# Patient Record
Sex: Female | Born: 1970 | Race: White | Hispanic: No | Marital: Married | Smoking: Never smoker
Health system: Southern US, Community
[De-identification: ages and names within clinical notes are randomized; demographics above are authoritative.]

## PROBLEM LIST (undated history)

## (undated) DIAGNOSIS — D6861 Antiphospholipid syndrome: Secondary | ICD-10-CM

## (undated) DIAGNOSIS — I2699 Other pulmonary embolism without acute cor pulmonale: Secondary | ICD-10-CM

## (undated) HISTORY — PX: STRABISMUS SURGERY: SHX218

## (undated) HISTORY — DX: Antiphospholipid syndrome: D68.61

## (undated) HISTORY — PX: SMALL INTESTINE SURGERY: SHX150

## (undated) HISTORY — DX: Other pulmonary embolism without acute cor pulmonale: I26.99

---

## 1998-06-03 DIAGNOSIS — D6861 Antiphospholipid syndrome: Secondary | ICD-10-CM

## 1998-06-03 HISTORY — DX: Antiphospholipid syndrome: D68.61

## 1998-08-04 ENCOUNTER — Other Ambulatory Visit: Admission: RE | Admit: 1998-08-04 | Discharge: 1998-08-04 | Payer: Self-pay | Admitting: Obstetrics and Gynecology

## 1998-08-18 ENCOUNTER — Ambulatory Visit (HOSPITAL_COMMUNITY): Admission: RE | Admit: 1998-08-18 | Discharge: 1998-08-18 | Payer: Self-pay | Admitting: Internal Medicine

## 1998-08-21 ENCOUNTER — Ambulatory Visit (HOSPITAL_COMMUNITY): Admission: RE | Admit: 1998-08-21 | Discharge: 1998-08-21 | Payer: Self-pay | Admitting: Internal Medicine

## 1998-08-21 ENCOUNTER — Encounter: Payer: Self-pay | Admitting: Internal Medicine

## 1998-11-08 ENCOUNTER — Encounter: Payer: Self-pay | Admitting: Internal Medicine

## 1998-11-08 ENCOUNTER — Ambulatory Visit: Admission: RE | Admit: 1998-11-08 | Discharge: 1998-11-08 | Payer: Self-pay | Admitting: Internal Medicine

## 1999-03-09 ENCOUNTER — Other Ambulatory Visit: Admission: RE | Admit: 1999-03-09 | Discharge: 1999-03-09 | Payer: Self-pay | Admitting: Psychology

## 1999-03-09 ENCOUNTER — Encounter (INDEPENDENT_AMBULATORY_CARE_PROVIDER_SITE_OTHER): Payer: Self-pay | Admitting: Specialist

## 1999-11-15 ENCOUNTER — Other Ambulatory Visit: Admission: RE | Admit: 1999-11-15 | Discharge: 1999-11-15 | Payer: Self-pay | Admitting: Obstetrics and Gynecology

## 1999-11-22 ENCOUNTER — Ambulatory Visit (HOSPITAL_COMMUNITY): Admission: RE | Admit: 1999-11-22 | Discharge: 1999-11-22 | Payer: Self-pay | Admitting: Obstetrics and Gynecology

## 1999-11-22 ENCOUNTER — Encounter (INDEPENDENT_AMBULATORY_CARE_PROVIDER_SITE_OTHER): Payer: Self-pay

## 2000-09-07 ENCOUNTER — Inpatient Hospital Stay (HOSPITAL_COMMUNITY): Admission: AD | Admit: 2000-09-07 | Discharge: 2000-09-07 | Payer: Self-pay | Admitting: Obstetrics and Gynecology

## 2000-09-07 ENCOUNTER — Encounter: Payer: Self-pay | Admitting: Obstetrics and Gynecology

## 2000-09-18 ENCOUNTER — Inpatient Hospital Stay (HOSPITAL_COMMUNITY): Admission: AD | Admit: 2000-09-18 | Discharge: 2000-09-18 | Payer: Self-pay | Admitting: *Deleted

## 2000-11-09 ENCOUNTER — Inpatient Hospital Stay (HOSPITAL_COMMUNITY): Admission: AD | Admit: 2000-11-09 | Discharge: 2000-11-09 | Payer: Self-pay | Admitting: Obstetrics and Gynecology

## 2000-11-18 ENCOUNTER — Encounter (INDEPENDENT_AMBULATORY_CARE_PROVIDER_SITE_OTHER): Payer: Self-pay

## 2000-11-18 ENCOUNTER — Inpatient Hospital Stay (HOSPITAL_COMMUNITY): Admission: AD | Admit: 2000-11-18 | Discharge: 2000-11-22 | Payer: Self-pay | Admitting: Obstetrics and Gynecology

## 2000-12-05 ENCOUNTER — Encounter: Admission: RE | Admit: 2000-12-05 | Discharge: 2001-01-04 | Payer: Self-pay | Admitting: Obstetrics and Gynecology

## 2001-01-02 ENCOUNTER — Other Ambulatory Visit: Admission: RE | Admit: 2001-01-02 | Discharge: 2001-01-02 | Payer: Self-pay | Admitting: Obstetrics and Gynecology

## 2001-02-20 ENCOUNTER — Encounter: Admission: RE | Admit: 2001-02-20 | Discharge: 2001-02-20 | Payer: Self-pay | Admitting: Urology

## 2001-02-20 ENCOUNTER — Encounter: Payer: Self-pay | Admitting: Urology

## 2001-04-16 ENCOUNTER — Encounter: Payer: Self-pay | Admitting: Urology

## 2001-04-23 ENCOUNTER — Encounter (INDEPENDENT_AMBULATORY_CARE_PROVIDER_SITE_OTHER): Payer: Self-pay | Admitting: Specialist

## 2001-04-23 ENCOUNTER — Inpatient Hospital Stay (HOSPITAL_COMMUNITY): Admission: RE | Admit: 2001-04-23 | Discharge: 2001-05-02 | Payer: Self-pay | Admitting: Urology

## 2002-02-09 ENCOUNTER — Encounter (INDEPENDENT_AMBULATORY_CARE_PROVIDER_SITE_OTHER): Payer: Self-pay

## 2002-02-09 ENCOUNTER — Ambulatory Visit (HOSPITAL_COMMUNITY): Admission: RE | Admit: 2002-02-09 | Discharge: 2002-02-09 | Payer: Self-pay | Admitting: Gastroenterology

## 2002-06-18 ENCOUNTER — Other Ambulatory Visit: Admission: RE | Admit: 2002-06-18 | Discharge: 2002-06-18 | Payer: Self-pay | Admitting: Obstetrics and Gynecology

## 2003-07-18 ENCOUNTER — Other Ambulatory Visit: Admission: RE | Admit: 2003-07-18 | Discharge: 2003-07-18 | Payer: Self-pay | Admitting: Dermatology

## 2004-11-29 ENCOUNTER — Other Ambulatory Visit: Admission: RE | Admit: 2004-11-29 | Discharge: 2004-11-29 | Payer: Self-pay | Admitting: Obstetrics and Gynecology

## 2008-05-14 ENCOUNTER — Inpatient Hospital Stay (HOSPITAL_COMMUNITY): Admission: AD | Admit: 2008-05-14 | Discharge: 2008-05-14 | Payer: Self-pay | Admitting: Obstetrics and Gynecology

## 2008-05-30 ENCOUNTER — Inpatient Hospital Stay (HOSPITAL_COMMUNITY): Admission: RE | Admit: 2008-05-30 | Discharge: 2008-06-02 | Payer: Self-pay | Admitting: Obstetrics and Gynecology

## 2010-10-16 NOTE — H&P (Signed)
Holly Tanner, Holly Tanner     ACCOUNT NO.:  [id number]   MEDICAL RECORD NO.:  [id number]          PATIENT TYPE:  INP   LOCATION:                                F[facility]ILITY:  [facility]   PHYSICIAN:  Duke Salvia. Marcelle Overlie, M.D.DATE OF BIRTH:  07/24/1970   DATE OF ADMISSION:  05/30/2008  DATE OF DISCHARGE:                              HISTORY & PHYSICAL   Date of scheduled surgery is December 28.   CHIEF COMPLAINT:  For repeat cesarean section.   HISTORY OF PRESENT ILLNESS:  This is a 40 year old G56, P1-0-2-1, EDD is  January 7, presents for repeat cesarean section.  She has a complicated  history in that she had delivered by cesarean in 2002 for CPD.  She has  a history of Crohn disease later that same year in 2002 when she  developed worsening abdominal pain.  She underwent midline laparotomy  with bowel resection, at which time she had an enterovesical fistula  repaired.  She has seen Dr. 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   Her GI doctor is Dr. Kinnie Scales.   This pregnancy has been uncomplicated with reactive weekly nonstress  tests.  She has had normal serial ultrasounds and has also been seen by  general surgery.  Presents at this time for scheduled repeat cesarean  section with general surgery in attendance.  The risks related to  bleeding, infection, adjacent organ injury, or the need for additional  surgery especially in regards to bowel adhesions were all reviewed with  her, which she understands and accepts.   She declined amniocentesis.  Her first trimester screen was normal.  One-  hour GTT was normal.   PAST MEDICAL HISTORY:   ALLERGIES:  1. TALWIN.  2. SULFA.  3. VICODIN.  4. ERYTHROMYCIN.  5. PENICILLIN.  6. CODEINE.  7. HYDROCODONE.   CURRENT MEDICATIONS:  Prenatal vitamins early in the pregnancy.  She was  on Lialda for Crohn disease which was discontinued during the pregnancy.  Blood type is B+, rubella  titer is equivocal.   OBSTETRICAL HISTORY:  She has had TAB in 1996, SAB in 2001 with a D and  E, cesarean section 2002 for CPD and macrosomia.   FAMILY. HISTORY:  Please see her [place] form for details.   PHYSICAL EXAM:  Temperature 98.2, blood pressure 120/82.  HEENT:  Unremarkable.  NECK:  Supple without masses.  LUNGS:  Clear.  CARDIOVASCULAR:  Regular rate and rhythm without murmurs, rubs or  gallops.  BREASTS:  Without masses.  Term fundal height.  Fetal heart rate 140.  Cervix was closed.  EXTREMITIES:  Reveal 1+ edema.  Reflexes 1-2+, no  clonus.   IMPRESSION:  1. Term pregnancy, previous cesarean section.  2. History of Crohn disease.  3. 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  4. Factor V Leiden.   PLAN:  Repeat cesarean section.  The procedure and risks reviewed as  above.  General surgery  will be in attendance.  She declined tubal  ligation per Dr. Shirlee More recommendation.  Plan Lovenox daily for 6-8  weeks postop.      Richard M. Marcelle Overlie, M.D.  Electronically Signed     RMH/MEDQ  D:  05/26/2008  T:  05/26/2008  Job:  [id number]

## 2010-10-16 NOTE — Op Note (Signed)
Holly Tanner, Holly Tanner               ACCOUNT NO.:  [id number]   MEDICAL RECORD NO.:  [id number]          PATIENT TYPE:  INP   LOCATION:  9121                          [facility]ILITY:  [facility]   PHYSICIAN:  Duke Salvia. Marcelle Overlie, M.D.DATE OF BIRTH:  [birth year]   DATE OF PROCEDURE:  05/30/2008  DATE OF DISCHARGE:                               OPERATIVE REPORT   PREOPERATIVE DIAGNOSES:  1. Term intrauterine pregnancy with previous cesarean section.  2. Repeat cesarean section.  3. History of Crohn disease with prior laparotomy.   POSTOPERATIVE DIAGNOSES:  1. Term intrauterine pregnancy with previous cesarean section.  2. Repeat cesarean section.  3. History of Crohn disease with prior laparotomy.   PROCEDURE:  Repeat low transverse cesarean section, lysis of adhesions.   ASSISTANT:  Juluis Mire, M.D.   ANESTHESIA:  Spinal.   COMPLICATIONS:  None.   DRAINS:  Foley catheter.   ESTIMATED BLOOD LOSS:  700 mL.   PROCEDURE AND FINDINGS:  The patient is taken to the operating room  after an adequate level of spinal anesthetic was obtained with the  patient in the left tilt position, the abdomen prepped and draped in the  usual manner for sterile abdominal procedures.  Foley catheter  positioned draining clear urine.  She received cefoxitin 1 g IV preop.  Pfannenstiel incision was made through her old scar, which was well  healed.  This is carried down to the fascia, which was incised and  extended transversely.  Rectus muscle divided in the midline.  Peritoneum entered superiorly without incident and extended vertically.  There were no significant adhesions into this area despite her history  of midline laparotomy and Crohn disease.  The vesicouterine serosa was  then incised.  The bladder was sharply and bluntly dissected below.  Bladder blade was repositioned.  Transverse incision made the lower  segment extended with bandage scissors.  Clear fluid noted.  The patient  delivered a healthy  female.  Apgars 9 and 9.  Weight is pending.  The  infant was suctioned, cord clamped and passed to pediatric team for  further care.  Placenta was delivered manually intact after drawing cord  blood specimens.  Cavity was wiped clean with laparotomy pack.  Fair  closure obtained with first layer of 0 chromic in a locked fashion  followed by an imbricating layer of 0 chromic.  This was hemostatic.  The bladder flap area was intact and hemostatic.  Right tube and ovary  were normal.  There was some filmy adhesions in the area of the left  tube and ovary, which were lysed in avascular plane.  No other  significant anterior uterine adhesions noted.  Prior to closure sponge,  needle and counts reported as correct x2.  Peritoneum closed with  running 2-0 Vicryl suture.  A 0 PDS was then used to close the fascia  from laterally to  midline on either side.  Prior to this rectus muscles reapproximated  midline with 2-0 Vicryl sutures.  Subcutaneous tissue was hemostatic.  Clips and Steri-Strips used on the skin along with a pressure dressing.  She tolerated  this well, went to recovery room in good condition.      Richard M. Marcelle Overlie, M.D.  Electronically Signed     RMH/MEDQ  D:  05/30/2008  T:  05/31/2008  Job:  [id number]

## 2010-10-19 NOTE — Discharge Summary (Signed)
[facility]  Patient:    Holly Tanner, Holly Tanner Visit Number: [id number] MRN: [id number]          Service Type: SUR Location: 4E [id number] [id number] Attending Physician:  Evlyn Clines Dictated by:   Excell Seltzer. Annabell Howells, M.D. Admit Date:  04/23/2001 Discharge Date: 05/02/2001   CC:         Velora Heckler, M.D.  Wilhemina Bonito. Eda Keys., M.D. [facility]  Richard M. Marcelle Overlie, M.D.  Kari Baars, M.D.   Discharge Summary  BRIEF HISTORY:  Holly Tanner is a 40 year old white female who was originally seen in consultation by Dr. Duke Salvia. [place] for recurring UTIs.  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  Dr. Velora Heckler was consulted and she is now admitted for resection of an involved bowel and closure of intravesical fistula.  ALLERGIES:  Pertinent for allergies to PENICILLIN, ERYTHROMYCIN, CODEINE, HYDROCODONE, and TALWIN.  MEDICATIONS:  Prenatal vitamin.  PAST MEDICAL HISTORY:  Significant for the Crohns disease.  She has also had an antiphospholipid with factor-5 abnormality.  PAST SURGICAL HISTORY:  Pertinent for cesarean section November 18, 2000.  A D&C on November 21, 1999.  Colonoscopy in 2000 and a second in 2001.  She had eye surgeries at age 89 and 44.  SOCIAL HISTORY:  No tobacco or alcohol.  FAMILY HISTORY:  Pertinent for the factor-5 abnormality.  For more details of the history and physical, please see the dictated note.  LABORATORY DATA:  Preoperative CT scan suggested involvement of possibly the sigmoid as well as the ileum in addition to the bladder.  Hemoglobin 13.7, white count 9. Chemistries within normal limits with the exception of sodium 146.  The PT was 13.3.  Urinalysis had 11 to 20 white cells per high powered field, too numerous to count red cells.  EKG showed normal sinus rhythm and normal EKG.  Chest x-ray showed no active disease.  HOSPITAL COURSE:  The patient was  admitted to the hospital on April 23, 2001.  She had received a mechanical and antibiotic bowel prep as well as perioperative antibiotics.  She then underwent abdominal exploration and was found to have both a colovesical fistula, enteric fistula.  Ileocecal resection was performed as well as partial sigmoid colectomy.  She also underwent partial cystectomy for closure of the intravesical fistula. She tolerated surgery well and was left with a Foley catheter.  Postoperatively, she was left with a morphine PCA; however, this was inadequate for pain control and she was switched to a Dilaudid PCA with improved pain control.  She had some sort of trigger point in her right lower quadrant which she had at previous surgery and this was causing significant discomfort.  She was kept in the intensive care unit initially for close monitoring.  Ativan was added on the first postoperative day.  She was doing well apart from her pain.  She also had near panic attack episodes.  Her hemoglobin was 13.8, white count 12.5, BMP within normal limits.  She had Ativan added for anxiety.  Repeat BMP later in the day revealed a low potassium of 3.3.  We ran some KCl in her IV fluids had increased potassium placed.  On the second postoperative day, her potassium was up to 3.9.  Her creatinine was 0.6.  She was very emotional throughout with complaints of her chronic abdominal tenderness on the right side.  Temperature maximum was afebrile on the second postoperative  day.  On the third postoperative day, her temperature maximum was 100.4, which she remained afebrile.  Urine output Foley was draining well.  Outputs were increasing.  She continued to have specific pain in her right lower quadrant.  On the fourth postoperative day, she was in much better spirits still having some right lower quadrant pain.  She had no flatus or bowel movement.  Her hemoglobin was 11.7, white count 7.8, electrolytes were  unremarkable.  She was noted to have a few bowel sounds.  On the fifth postoperative day, she was complaining of discomfort from her Foley catheter.  She was given some Levsin SL for bladder spasm.  She did tolerate plugging of her NG tube so it was removed but she was kept NPO for her ileostomy.  On the 27th, she was having a little bit of vaginal bloody discharge but no other complaints.  On the 28th, she continued to do well without fever.  She was having occasional nausea.  She had been allowed clear liquids but was not taking much urine.  She was slightly distended on clear liquids.  On the 28th, her Foley catheter was removed.  Her only urologic complaint was some mild dysuria after removal of her Foley catheter.  On the 29th, her bowel function seemed to be returning and her belly was soft. Her wound was intact.  Her diet was advanced.  On the 30th, her staples were removed.   She was felt to be ready for discharge home.  She was voiding without complaint.  She was tolerating her diet.  DISCHARGE MEDICATIONS:  Resumption of her preoperative medications.  She was given Percocet for pain and Ativan for anxiety.  FOLLOW-UP:  Instructed to follow up with Dr. Excell Seltzer. [place] in two weeks and Dr. Velora Heckler in one week.  FINAL DIAGNOSES: 1. 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 2. Antiphospholipid syndrome factor-5 abnormality.  DISPOSITION:  To home.  COMPLICATIONS:  Her admission was complicated by prolonged postoperative ileus.  Her condition is improved.  Her prognosis is good. Dictated by:   Excell Seltzer. Annabell Howells, M.D. Attending Physician:  Evlyn Clines DD:  05/29/01 TD:  05/29/01 Job: [id number]

## 2010-10-19 NOTE — Op Note (Signed)
[facility]  Patient:    Holly Tanner, Holly Tanner                   MRN: [id number] Attending:  Cordelia Pen Ii                           Operative Report  PREOPERATIVE DIAGNOSIS:       Term intrauterine pregnancy.  Fetal macrosomia by ultrasound, declines trial of labor.  POSTOPERATIVE DIAGNOSIS:      Term intrauterine pregnancy.  Fetal macrosomia by ultrasound, declines trial of labor.  OPERATION:                    Primary low transverse cesarean section.  SURGEON:                      Duke Salvia. Marcelle Overlie, M.D.  ASSISTANT:  ANESTHESIA:                   Spinal.  COMPLICATIONS:                None.  DRAINS:                       Foley catheter.  ESTIMATED BLOOD LOSS:         900 cc.  DESCRIPTION OF PROCEDURE:     The patient was taken to the operating room and after an adequate level of spinal anesthetic was obtained with the patient in left tilt position, the abdomen was prepped and draped in the usual fashion for sterile abdominal procedures.  Foley catheter positioned draining clear urine.  Transverse incision was made two fingerbreadths above the symphysis and carried down to the fascia which was excised and extended transversely. Rectus muscles divided in the midline.  The peritoneum entered superiorly without incident and extended in a vertical manner.  The bladder blade was positioned.  The vesicouterine serosa was incised and there was some scarring on the left angle of the uterus from either colon or a loop of intestine, it was difficult to tell.  This patient does have a history of Crohns disease. There was also some scarring noted in the area of the bladder flap making dissection of the vesicouterine serosa difficult.  Therefore, incision in the uterus was made higher up free and clear of this area.  This was extended with bandage scissors.  Clear amniotic fluid was noted.  Easy delivery of a 7 pound 13 ounce female, Apgars 8  and 9.  Cord blood was obtained, also sent specimen for the blood bank registry per protocol.  The placenta delivered manually intact and sent to pathology.  The uterus was exteriorized.  The cavity was wiped clean with laparotomy pack.  The tubes and ovaries were normal. Inspection of the left angle of incision revealed the bladder to be well below the incision.  Again the above mentioned scarring of either colon or small bowel into the area of the left round ligament and left lateral uterus were free and clear from the surgical site.  These areas were not dissected. Closure obtained with the first layer of 0 chromic in a locked fashion followed by an imbricating layer of 0 chromic.  This was hemostatic.  Prior to closure, sponge, needle, and instrument counts were correct x 2.  The rectus muscles were reapproximated with 2-0 Dexon interrupted sutures.  Fascia closed from  laterally to midline on either side with a 0 Panocryl suture. Subcutaneous fat was hemostatic after irrigation.  Clips and Steri-Strips were used on the skin.  She did receive Pitocin IV after the cord was clamped along with Cefotan 1 gram IV.  She will receive anticoagulation for six weeks postpartum.  I will contact Dr. Asencion Islam, her hematologist at [facility] for their protocol. DD:  11/18/00 TD:  11/18/00 Job: [id number]

## 2010-10-19 NOTE — Discharge Summary (Signed)
Holly Tanner, Holly Tanner               ACCOUNT NO.:  [id number]   MEDICAL RECORD NO.:  [id number]          PATIENT TYPE:  INP   LOCATION:  9121                          [facility]ILITY[facility]   PHYSICIAN:  Duke Salvia. Marcelle Overlie, M.D.DATE OF BIRTH:  27-Jan-1971   DATE OF ADMISSION:  05/30/2008  DATE OF DISCHARGE:  06/02/2008                               DISCHARGE SUMMARY   ADMITTING DIAGNOSES:  1. Intrauterine pregnancy at term.  2. Previous cesarean section.  3. History of Crohn disease with bowel resection.  4. Factor V Leiden.   DISCHARGE DIAGNOSES:  1. Status post low transverse cesarean section.  2. A viable female infant.  3. Factor V Leiden.   PROCEDURE:  Repeat low transverse cesarean section.   REASON FOR ADMISSION:  Please see dictated H&P.   HOSPITAL COURSE:  The patient is a 37-year gravida 4, para 1-0-2-1 that  was admitted to [facility] for scheduled cesarean  section.  The patient's pregnancy had been complicated by history of her  previous cesarean section in 2002 for cephalopelvic disproportion.  The  patient also did have a history of Crohn disease which later in the same  year had worsened, the patient had undergone a laparotomy with a bowel  resection at which time she developed an enterovesical fistula, which  was repaired.  The patient also had a history of being a carrier factor  V Leiden.  On the morning of admission, the patient was taken to  operating room where spinal anesthesia was administered without  difficulty.  A low transverse incision was made with delivery of a  viable female infant, weighing 8 pounds 2 ounces with Apgars of 9 at 1-  minute and 9 at 5 minutes.  The patient tolerated the procedure well and  taken to the recovery room in stable condition.  On postoperative day  #1, the patient complained of some gas pain.  Vital signs were otherwise  stable.  She was afebrile.  Abdomen, soft and nontender.  Abdominal  dressing noted to be clean,  dry, and intact.  On postoperative day #2,  the patient did complain of some abdominal pain related to gas, vital  signs were stable with blood pressure 105/71.  She was afebrile.  Incision was clean, dry, and intact.  Abdomen was moderately distended  with deep bowel sounds.  The patient was encouraged to ambulate and  Dulcolax suppository was given.  On postoperative day #3, the patient  had had some results from the suppository.  Vital signs were otherwise  stable.  Abdomen was soft with good return of bowel function.  Fundus  was firm and nontender.  Incision was noted to have some slight erythema  noted superior to the incisional site.  Staples were left intact.  Discharge instructions were given and the patient was later discharged  home.   CONDITION ON DISCHARGE:  Stable.   DIET:  Regular as tolerated.   ACTIVITY:  No heavy lifting, no driving x2 weeks,  no vaginal entry.   FOLLOWUP:  The patient is to follow up in the office in 2 days  for an  incision check and staple removal.  She is to call for temperature  greater than 100 degrees, persistent nausea, vomiting, heavy vaginal  bleeding and/or redness or drainage from the incisional site.   DISCHARGE MEDICATIONS:  1. Tylox #30, one p.o., every 4-6 hours.  2. Lovenox subcu daily.  3. Prenatal vitamins one p.o. daily.      Julio Sicks, N.P.      Richard M. Marcelle Overlie, M.D.  Electronically Signed    CC/MEDQ  D:  07/11/2008  T:  07/11/2008  Job:  [id number]

## 2010-10-19 NOTE — Discharge Summary (Signed)
[facility]  Patient:    Holly Tanner, Holly Tanner                   MRN: [id number] Adm. Date:  [id number] Disc. Date: [id number] Attending:  Rhina Brackett Dictator:   Danie Chandler, R.N.                           Discharge Summary  ADMITTING DIAGNOSES:          1. Intrauterine pregnancy at term.                               2. Fetal macrosomia by ultrasound, declines                                  trial of labor.  DISCHARGE DIAGNOSES:          1. Intrauterine pregnancy at term.                               2. Fetal macrosomia by ultrasound, declines                                  trial of labor.  PROCEDURE:                    On November 18, 2000 primary low transverse cesarean section.  REASON FOR ADMISSION:         Please see dictated H&P.  HOSPITAL COURSE:              The patient was taken to the operating room and underwent the above named procedure without complications.  This was productive of a viable female infant with Apgars of 8 at one minute and 9 at five minutes.  Postoperatively the patient was placed on Lovenox and Coumadin due to factor 5 Leiden deficiency and on postoperative day #1 the patient was without complaints except for some soreness.  Her hemoglobin on this day was 10.8, hematocrit 32.4, white blood cell count 14.4.  On postoperative day #2 the patient was continued on her Lovenox and was started on the Coumadin.  On postoperative day #3 she was tolerating a regular diet and ambulating well without difficulty.  She also had good pain control and good return of bowel function.  She was discharged home on postoperative day #4.  CONDITION ON DISCHARGE:       Good.  DIET:                         Regular, as tolerated.  ACTIVITY:                     No heavy lifting, no driving, no vaginal entry.  FOLLOW-UP:                    In the office in one to two weeks for incision check.  She is to call for temperature greater  than 100 degrees, persistent nausea or vomiting, heavy vaginal bleeding, and/or redness or drainage from the incision site.  DISCHARGE MEDICATIONS:        1. Prenatal vitamins one  p.o. q.d.                               2. Darvocet-N 100 #40 one to two p.o. q.4h.                                  p.r.n. pain.                               3. Motrin 600 mg one p.o. q.6h. p.r.n. pain.                               4. Coumadin #20 5 mg one p.o. q.d. DD:  12/12/00 TD:  12/12/00 Job: [id number]

## 2010-10-19 NOTE — Op Note (Signed)
[facility]  Patient:    Holly Tanner, Holly Tanner Visit Number: [id number] MRN: [id number]          Service Type: SUR Location: 2S 6213 01 Attending Physician:  Evlyn Clines Dictated by:   Excell Seltzer. Annabell Howells, M.D. Proc. Date: 04/23/01 Admit Date:  04/23/2001   CC:         Duke Salvia. Marcelle Overlie, M.D.  Kari Baars, M.D.  Velora Heckler, M.D.  Wilhemina Bonito. Eda Keys., M.D. [facility]   Operative Report  PREOPERATIVE DIAGNOSIS:  Intravesical fistula.  POSTOPERATIVE DIAGNOSIS:  Intravesical fistula.  PROCEDURE:  Partial cystectomy for closure of intravesical fistula.  SURGEON:  Excell Seltzer. Annabell Howells, M.D.  ASSISTANT:  Velora Heckler, M.D.  ANESTHESIA:  General.  DRAIN:  Foley catheter.  SPECIMEN:  Fistula tract.  COMPLICATIONS:  None.  INDICATIONS:  The patient is a 40 year old white female who was sent for evaluation of recurrent urinary tract infections.  She had a history of Crohns disease.  Cystoscopy in the office demonstrated a presumed fistula tract into the bladder.  Further studies with CT suggested an inflammatory bowel mass in the area of the dome of the bladder.  It was felt that exploration with resection of effected bowel and removal of fistula tract were indicated.  FINDINGS AND DESCRIPTION OF PROCEDURE:  The patient was given IV Cefotan.  She was taken to the operating room where a general anesthetic was induced. Dr. Darnell Level performed the initial portion of the procedure, and the patient was prepped with Betadine solution, and draped in the usual sterile fashion.  A Foley catheter was inserted and was kept on the field.  Dr. Gerrit Friends created a midline incision.  During the dissection, it was found that the terminal ileum had fistulized to the sigmoid colon which had been fistulized into the bladder.  The bowel was dissected off the dome of the bladder. Dr. Gerrit Friends then performed resection of the terminal ileum and cecum, and  the involved sigmoid colon.  Once this portion of the repair had been completed, I filled the bladder with irrigant.  Made an anterior cystotomy with the Bovie and opened the bladder.  Raytec sponges and a sweetheart retractor were used. The dome of the bladder was retracted superiorly.  The fistula tract was noted on the posterior wall of the bladder, well away from the trigone.  It was circumscribed with the Bovie, and grasped with Allis clamp, and the fistula tract was dissected down through muscle to serosa, and then removed.  The posterior cystotomy was then closed using running 2-0 chromic muscular layer followed by a running 2-0 chromic mucosal layer.  The anterior cystotomy was then closed in two layers with running 2-0 chromic stitches.  The bladder was then filled under pressure and no evidence of leakage of irrigant was noted.  At this point, the omentum was positioned between the bowel and the dome of the bladder.  Final inspection revealed no active bleeding.  Sponge, instrument, and needle counts were correct.  The wound was then closed by Dr. Gerrit Friends with Novofil followed by skin clips.  This portion of the procedure will be dictated in a separate note.  Of note, the patient had a factor V deficiency, and will be started on Lovenox postoperatively.  The patients anesthetic was reversed.  She was taken to the recovery room in stable condition and there were no complications. Dictated by:   Excell Seltzer. Annabell Howells, M.D. Attending Physician:  Evlyn Clines DD:  04/23/01 TD:  04/24/01 Job: [id number]

## 2010-10-19 NOTE — Op Note (Signed)
[facility]  Patient:    Holly Tanner, Holly Tanner Visit Number: [id number] MRN: [id number]          Service Type: SUR Location: 2S 3244 01 Attending Physician:  Evlyn Clines Dictated by:   Velora Heckler, M.D. Proc. Date: 04/23/01 Admit Date:  04/23/2001   CC:         Excell Seltzer. Annabell Howells, M.D.  Kari Baars, M.D.  Wilhemina Bonito. Eda Keys., M.D. [facility]  Dr. Cherie Dark, assistant professor, Dept. of Medicine, Division of hematology/oncology,             [place], [place]  [id number]   Operative Report  PREOPERATIVE DIAGNOSIS:  Crohns disease with enteric fistula.  POSTOPERATIVE DIAGNOSES: 1. Crohns disease. 2. Colovesical fistula. 3. Coloenteric fistula.  PROCEDURE: 1. Ileocecectomy. 2. Sigmoid colectomy. 3. Partial cystectomy (dictated under separate operative report).  SURGEON:  Velora Heckler, M.D.  ASSISTANT:  Excell Seltzer. Annabell Howells, M.D.  ANESTHESIA:  General per Ninfa Meeker, M.D.  ESTIMATED BLOOD LOSS:  100 cc.  PREPARATION:  Betadine.  COMPLICATIONS:  None.  INDICATIONS:  The patient is a 40 year old white female from [place], [place], who presents with a two year history of Crohns disease.  The patient has developed signs and symptoms of enterovesical fistula.  She has had recurrent urinary tract infections.  Cystoscopy demonstrated an erythematous and edematous lesion in the central posterior wall of the bladder consistent with enterovesical fistula.  Urinary cultures grow out E. coli. The patient now comes to surgery for exploration and repair of enterovesical fistula.  DESCRIPTION OF PROCEDURE:  The procedure was done in OR #6 at [facility].  The patient was brought to the operating room and placed in the supine position on the operating room table.  Following administration of general anesthesia, the patient is positioned, and then prepped and draped in the usual strict aseptic fashion.  Dr. Bjorn Pippin placed a Foley catheter under sterile conditions.  The abdomen was then prepped and draped.  The midline incision was made with a #10 blade.  Dissection was carried down through the subcutaneous tissues and hemostasis obtained with the electrocautery.  Dissection was carried down to the fascia.  The fascia was incised in the midline.  The peritoneal cavity was entered cautiously.  There are some omental adhesions to the patients previous Pfannenstiel incision. These are mobilized and hemostasis obtained with the electrocautery.  There is a moderate-sized inflammatory mass at the posterior wall of the bladder involving both sigmoid colon and terminal ileum.  Terminal ileum has pathognomonic changes of Crohns disease with creeping fat extending for approximately 15 inches from the ileocecal valve proximally.  The sigmoid colon shows mild diverticular change.  There are some adhesions to the lateral abdominal wall.  A Bookwalter retractor was placed for exposure.  The sigmoid colon was mobilized from its lateral peritoneal attachments.  Dissection was begun at the inflammatory mass at the posterior wall of the bladder.  This was mobilized using the electrocautery for hemostasis.  There is an obvious sizeable fistula between the sigmoid colon and the bladder.  This was transected.  There is a second fistula between the terminal ileum and the sigmoid colon.  This was also transected.  The fistulous openings are closed with interrupted 2-0 silk figure-of-eight sutures.  The decision was made to proceed with ileocecectomy.  Therefore, the ileum is transected using a GIA stapler, approximately 15 inches prior to the ileocecal valve.  At this point, the  small bowel appears grossly normal with limited creeping fat.  Ascending colon is mobilized from its lateral peritoneal attachments.  The ascending colon is divided approximately 4 cm above the ileocecal valve, again using a GIA-type stapler.   The mesenteric vessels are divided between [facility] clamps and ligated with 2-0 silk ties and 2-0 silk suture ligatures.  The entire terminal ileum and cecum are then resected and passed off the field as a specimen.  Next, a side-to-side functional end-to-end anastomosis is created between the terminal ileum and the ascending colon.  The two are aligned with interrupted 2-0 silk sutures.  A 75 mm GIA-type stapler is inserted through enterotomies and fired along the tenia.  Staple line is inspected and hemostasis obtained with interrupted 3-0 silk sutures.  The enterotomy was closed with application of a TA30 stapler.  The mesenteric defect was closed with interrupted 2-0 silk sutures.  The small bowel and colon were then packed cephalad and retracted with the Bookwalter retractor.  Next, the sigmoid colon was further mobilized.  A point in the proximal sigmoid colon is selected and transected between bowel clamps.  A point distal to the fistulous tract was selected and carefully dissected out circumferentially.  The mesentery was divided between [facility] clamps and ligated with 2-0 silk ties and 2-0 silk suture ligatures.  Stay sutures were placed in the distal sigmoid colon.  Sigmoid colon was then transected below the level of the fistula between bowel clamps.  The specimen was passed off the field. Hemostasis was obtained with the electrocautery and with 2-0 silk ligatures.  Next, an end-to-end anastomosis is created between the proximal and distal sigmoid colon using interrupted 3-0 silk sutures.  Good hemostasis was noted. The mesenteric defect was closed with interrupted 2-0 silk sutures.  The abdomen was copiously irrigated with 2-3 L of warm normal saline which evacuated.  Good hemostasis was noted throughout.   At this point, Dr. Bjorn Pippin proceeded with partial cystectomy.  This will be dictated under a separate operative report.  Following partial cystectomy, the abdomen was  again irrigated with warm saline, and good hemostasis was noted.  All packs were removed.  The Bookwalter retractor was removed.  Omentum was used to cover the small bowel and is laid in the cul-de-sac behind the bladder and anterior to the uterus. The midline wound was closed with a running #1 Novofil suture.  Subcutaneous tissues were irrigated and hemostasis obtained with the electrocautery.  The skin edges were reapproximated with stainless steel staples.  A Foley catheter was placed to drainage.  Sterile occlusive dressings are applied.  The patient was awakened from anesthesia and brought to the recovery room in stable condition.  The patient tolerated the procedure well. Dictated by:   Velora Heckler, M.D. Attending Physician:  Evlyn Clines DD:  04/23/01 TD:  04/24/01 Job: [id number]

## 2010-10-19 NOTE — Op Note (Signed)
[facility]  Patient:    Holly Tanner, Holly Tanner Visit Number: [id number] MRN: [id number]          Service Type: SUR Location: 4E 0423 01 Attending Physician:  Evlyn Clines Dictated by:   Excell Seltzer. Annabell Howells, M.D. Admit Date:  04/23/2001 Discharge Date: 05/02/2001   CC:         Velora Heckler, M.D.  Wilhemina Bonito. Eda Keys., M.D. [facility]  Richard M. Marcelle Overlie, M.D.  Kari Baars, M.D.   Operative Report  This is a redictation.  PROCEDURE:  Repair of intravesical fistula, partial bladder resection.  PREOPERATIVE DIAGNOSIS:  Intravesical fistula.  POSTOPERATIVE DIAGNOSIS:  Intravesical fistula.  SURGEON:  Excell Seltzer. Annabell Howells, M.D.  ASSISTANT:  Velora Heckler, M.D.  DRAIN:  Foley catheter.  COMPLICATIONS:  None.  SPECIMEN:  Fistula tract.  INDICATIONS:  Holly Tanner is a 40 year old white female with a history of Crohns inner colitis, who was found on evaluation of her recurrent urinary tract infections to have evidence of an intravesical fistula.  She is scheduled in conjunction with Dr. Gerrit Friends for abdominal exploration with resection of affected bowel and bladder component in the fistula.  FINDINGS AND PROCEDURE:  The patient had been given appropriate antibiotic mechanical bowel prep and received perioperative antibiotics.  She was taken to the operating room where a general anesthetic was induced.  She was placed in the supine position.  A Foley catheter was then placed with sterile technique.  She was prepped with Betadine solution and draped in the usual sterile fashion.  A Foley catheter was placed and kept on the field. Dr. Gerrit Friends then made a midline incision for his portion of the procedure.  She was found to have fistulas from the sigmoid to the bladder and from the ileum to the sigmoid, and resection was performed followed by sigmoid resection. Once the bowel anastomosis had been completed, I then performed my portion of the procedure.  The  bladder was filled with sterile fluid.  An anterior cystotomy was created, exposing the interior of the bladder.  The fistula tract was noted on the posterior wall of the bladder, well away from the ureteral orifices.  The tract and surrounding inflammation was excised using the Bovie; approximately 3 cm across was removed.  Once the fistula tract was removed, the posterior wall of the bladder was closed in two layers using running 2-0 chromic suture.  The anterior bladder wall was then closed using two layers of running 2-0 chromic suture.  The bladder repair was then tested by filling the bladder; no leakage was noted.  At this point, Dr. Gerrit Friends took over and closed the incisions.  There were no complications during my portion of the procedure. Dictated by:   Excell Seltzer. Annabell Howells, M.D. Attending Physician:  Evlyn Clines DD:  05/29/01 TD:  05/29/01 Job: [id number]

## 2010-10-19 NOTE — Op Note (Signed)
[facility]  Patient:    Holly Tanner, Holly Tanner                   MRN: [id number] Proc. Date: 11/22/99 Adm. Date:  [id number] Disc. Date: [id number] Attending:  Rhina Brackett                           Operative Report  PREOPERATIVE DIAGNOSIS:       Missed abortion.  POSTOPERATIVE DIAGNOSIS:      Missed abortion.  PROCEDURE:                    Dilation and evacuation.  SURGEON:                      Duke Salvia. Marcelle Overlie, M.D.  ANESTHESIA:                   MAC plus paracervical block.  PROCEDURE AND FINDINGS:       The patient was taken to the operating room. After an adequate level of sedation was obtained with the legs in stirrups, the perineum and vagina were prepped and draped in the usual manner for vaginal procedures.  The bladder was drained.  EUA was carried out.  The uterus was 6-7 weeks in size, mid to slightly posterior.  Adnexa negative.  A speculum was positioned and the cervix was grasped with a tenaculum. Paracervical block was obtained by infiltrating at 3 and 9 oclock submucosally with 5-7 cc of 1% Xylocaine on either side after negative aspiration.  The uterus was then sounded to 8-9 cm.  There was slight posterior axis.  Dilated to a #32 Pratt.  A #7 curved suction curet was then used to suction and moderate amount of tissue.  When no further tissue could be removed, a small blunt curet was used to explore the cavity, revealing it to be clean.  There was a minimal bleeding.  She tolerated this well and went to the recovery room in good condition.  Per her hematologists recommendation because she is factor V Leyden positive, tomorrow will start on Luvox 40 mg subcutaneously daily for six weeks, with monitoring of her APTT. DD:  11/22/99 TD:  11/25/99 Job: [id number]

## 2010-10-19 NOTE — H&P (Signed)
[facility]  Patient:    Holly Tanner, Holly Tanner                   MRN: [id number] Adm. Date:  [id number] Attending:  Rhina Brackett                         History and Physical  CHIEF COMPLAINT:              Missed Ab.  HISTORY OF PRESENT ILLNESS:   [age]-year-old G2, P0-0-1-0 at [redacted] weeks gestation, was seen in the office November 15, 1999 for a new OB visit and was noted to have an empty sac.  Quantitative hCGs were obtained which returned 35,093 on November 16, 1999, had decreased to 24,556 on November 19, 1999, with a progesterone of 7.5; repeat ultrasound showing empty sac, no change.  She has been evaluated at [facility] for family history of factor V leiden, with evaluation showing negative protein S, protein C, but she is heterozygous for factor V leiden.  Her hematology-oncology physician at [facility] by phone yesterday did recommend anticoagulation for six weeks post D&E; this is Dr. Cherie Dark, again at [facility].  The procedure including risks relative to bleeding, infection, adjacent organ injury, perforation that may require additional surgery, all discussed with her.  Blood type is B-positive.  ALLERGIES:                    PENICILLIN, ERYTHROMYCIN, CODEINE, VICODIN, TALWIN, HYDROCODONE.  OPERATIONS:                   D&E in 1996 for a TAB.  REVIEW OF SYSTEMS:            Her review of systems is otherwise negative except for factor V leiden and Crohns disease, not currently on medications.  PHYSICAL EXAMINATION:  VITAL SIGNS:                  Temperature 98.2.  Blood pressure 100/68.  HEENT:                        Unremarkable.  NECK:                         Supple without mass.  LUNGS:                        Clear.  CARDIOVASCULAR:               Regular rate and rhythm, without murmurs, rubs, or gallops noted.  BREASTS:                      Without masses.  ABDOMEN:                      Soft, flat and  nontender.  PELVIC:                       Normal external genitalia.  Vagina and cervix clear.  No bleeding noted.  Uterus 6- to 7-weeks-size, mid to posterior. Adnexa negative.  IMPRESSION:                   Missed abortion.  PLAN:  D&E.  Procedure and risks discussed, as above. We will plan to do postoperative anticoagulation for six weeks and follow her coagulation profile. DD:  11/22/99 TD:  11/22/99 Job: [id number]

## 2010-11-11 ENCOUNTER — Emergency Department (HOSPITAL_COMMUNITY): Payer: BC Managed Care – PPO

## 2010-11-11 ENCOUNTER — Inpatient Hospital Stay (HOSPITAL_COMMUNITY)
Admission: EM | Admit: 2010-11-11 | Discharge: 2010-11-14 | DRG: 078 | Disposition: A | Discharge status: Home / Self Care | Payer: BC Managed Care – PPO | Attending: Internal Medicine | Admitting: Internal Medicine

## 2010-11-11 ENCOUNTER — Inpatient Hospital Stay (INDEPENDENT_AMBULATORY_CARE_PROVIDER_SITE_OTHER)
Admission: RE | Admit: 2010-11-11 | Discharge: 2010-11-11 | Disposition: A | Discharge status: Home / Self Care | Payer: BC Managed Care – PPO | Source: Ambulatory Visit | Attending: Family Medicine | Admitting: Family Medicine

## 2010-11-11 DIAGNOSIS — J9819 Other pulmonary collapse: Secondary | ICD-10-CM | POA: Diagnosis present

## 2010-11-11 DIAGNOSIS — D6859 Other primary thrombophilia: Secondary | ICD-10-CM | POA: Diagnosis present

## 2010-11-11 DIAGNOSIS — R112 Nausea with vomiting, unspecified: Secondary | ICD-10-CM | POA: Diagnosis present

## 2010-11-11 DIAGNOSIS — I2699 Other pulmonary embolism without acute cor pulmonale: Secondary | ICD-10-CM

## 2010-11-11 DIAGNOSIS — Z880 Allergy status to penicillin: Secondary | ICD-10-CM

## 2010-11-11 DIAGNOSIS — M549 Dorsalgia, unspecified: Secondary | ICD-10-CM | POA: Diagnosis present

## 2010-11-11 DIAGNOSIS — K7689 Other specified diseases of liver: Secondary | ICD-10-CM | POA: Diagnosis present

## 2010-11-11 DIAGNOSIS — R079 Chest pain, unspecified: Secondary | ICD-10-CM

## 2010-11-11 DIAGNOSIS — R894 Abnormal immunological findings in specimens from other organs, systems and tissues: Secondary | ICD-10-CM | POA: Diagnosis present

## 2010-11-11 DIAGNOSIS — Z23 Encounter for immunization: Secondary | ICD-10-CM

## 2010-11-11 DIAGNOSIS — Z882 Allergy status to sulfonamides status: Secondary | ICD-10-CM

## 2010-11-11 DIAGNOSIS — Z7901 Long term (current) use of anticoagulants: Secondary | ICD-10-CM

## 2010-11-11 DIAGNOSIS — K5090 Crohn's disease, unspecified, without complications: Secondary | ICD-10-CM | POA: Diagnosis present

## 2010-11-11 DIAGNOSIS — R51 Headache: Secondary | ICD-10-CM | POA: Diagnosis present

## 2010-11-11 HISTORY — DX: Other pulmonary embolism without acute cor pulmonale: I26.99

## 2010-11-11 LAB — CK TOTAL AND CKMB (NOT AT ARMC)
CK, MB: 0.9 ng/mL (ref 0.3–4.0)
Relative Index: INVALID (ref 0.0–2.5)

## 2010-11-11 LAB — DIFFERENTIAL
Basophils Relative: 1 % (ref 0–1)
Eosinophils Absolute: 0.3 10*3/uL (ref 0.0–0.7)
Lymphs Abs: 3.1 10*3/uL (ref 0.7–4.0)
Monocytes Absolute: 1 10*3/uL (ref 0.1–1.0)
Monocytes Relative: 9 % (ref 3–12)
Neutrophils Relative %: 60 % (ref 43–77)

## 2010-11-11 LAB — COMPREHENSIVE METABOLIC PANEL
Albumin: 3.3 g/dL — ABNORMAL LOW (ref 3.5–5.2)
BUN: 9 mg/dL (ref 6–23)
Chloride: 103 mEq/L (ref 96–112)
Creatinine, Ser: 0.53 mg/dL (ref 0.4–1.2)
Total Bilirubin: 0.3 mg/dL (ref 0.3–1.2)

## 2010-11-11 LAB — PROTIME-INR
INR: 1.03 (ref 0.00–1.49)
Prothrombin Time: 13.7 seconds (ref 11.6–15.2)

## 2010-11-11 LAB — CBC
Hemoglobin: 12.5 g/dL (ref 12.0–15.0)
MCH: 28.2 pg (ref 26.0–34.0)
MCHC: 33 g/dL (ref 30.0–36.0)
MCV: 85.6 fL (ref 78.0–100.0)
Platelets: 417 10*3/uL — ABNORMAL HIGH (ref 150–400)
RBC: 4.43 MIL/uL (ref 3.87–5.11)

## 2010-11-11 LAB — D-DIMER, QUANTITATIVE: D-Dimer, Quant: 2.54 ug/mL-FEU — ABNORMAL HIGH (ref 0.00–0.48)

## 2010-11-11 MED ORDER — IOHEXOL 300 MG/ML  SOLN
100.0000 mL | Freq: Once | INTRAMUSCULAR | Status: AC | PRN
  Administered 2010-11-11: 77 mL via INTRAVENOUS

## 2010-11-12 DIAGNOSIS — I2699 Other pulmonary embolism without acute cor pulmonale: Secondary | ICD-10-CM

## 2010-11-12 LAB — PROTIME-INR: INR: 1.05 (ref 0.00–1.49)

## 2010-11-13 LAB — BASIC METABOLIC PANEL
CO2: 27 mEq/L (ref 19–32)
Chloride: 104 mEq/L (ref 96–112)
Potassium: 3.6 mEq/L (ref 3.5–5.1)
Sodium: 139 mEq/L (ref 135–145)

## 2010-11-13 LAB — CBC
HCT: 36 % (ref 36.0–46.0)
Hemoglobin: 11.8 g/dL — ABNORMAL LOW (ref 12.0–15.0)
MCHC: 32.8 g/dL (ref 30.0–36.0)
RBC: 4.19 MIL/uL (ref 3.87–5.11)
WBC: 9.1 10*3/uL (ref 4.0–10.5)

## 2010-11-13 LAB — HEPARIN LEVEL (UNFRACTIONATED): Heparin Unfractionated: 0.34 IU/mL (ref 0.30–0.70)

## 2010-11-13 LAB — PROTIME-INR: INR: 1.2 (ref 0.00–1.49)

## 2010-11-13 LAB — HIGH SENSITIVITY CRP: CRP, High Sensitivity: 59.7 mg/L — ABNORMAL HIGH

## 2010-11-14 LAB — CBC
HCT: 36.4 % (ref 36.0–46.0)
Hemoglobin: 12 g/dL (ref 12.0–15.0)
MCH: 28.2 pg (ref 26.0–34.0)
MCHC: 33 g/dL (ref 30.0–36.0)
Platelets: 324 10*3/uL (ref 150–400)
RDW: 12.9 % (ref 11.5–15.5)
WBC: 9 10*3/uL (ref 4.0–10.5)

## 2010-11-14 LAB — BASIC METABOLIC PANEL
BUN: 7 mg/dL (ref 6–23)
CO2: 24 mEq/L (ref 19–32)
Calcium: 8.4 mg/dL (ref 8.4–10.5)
Glucose, Bld: 90 mg/dL (ref 70–99)
Sodium: 137 mEq/L (ref 135–145)

## 2010-11-23 ENCOUNTER — Other Ambulatory Visit: Payer: Self-pay | Admitting: Hematology and Oncology

## 2010-11-23 ENCOUNTER — Encounter (HOSPITAL_BASED_OUTPATIENT_CLINIC_OR_DEPARTMENT_OTHER): Payer: BC Managed Care – PPO | Admitting: Hematology and Oncology

## 2010-11-23 DIAGNOSIS — D689 Coagulation defect, unspecified: Secondary | ICD-10-CM

## 2010-11-23 DIAGNOSIS — I2699 Other pulmonary embolism without acute cor pulmonale: Secondary | ICD-10-CM

## 2010-11-23 DIAGNOSIS — K5090 Crohn's disease, unspecified, without complications: Secondary | ICD-10-CM

## 2010-11-23 LAB — CBC WITH DIFFERENTIAL/PLATELET
Basophils Absolute: 0.1 10*3/uL (ref 0.0–0.1)
Eosinophils Absolute: 0.3 10*3/uL (ref 0.0–0.5)
LYMPH%: 30.8 % (ref 14.0–49.7)
MCH: 28.7 pg (ref 25.1–34.0)
MCV: 84.7 fL (ref 79.5–101.0)
MONO%: 6.7 % (ref 0.0–14.0)
NEUT#: 6.1 10*3/uL (ref 1.5–6.5)
Platelets: 376 10*3/uL (ref 145–400)
RBC: 4.89 10*6/uL (ref 3.70–5.45)

## 2010-11-23 LAB — APTT: aPTT: 36 seconds (ref 24–37)

## 2010-11-27 ENCOUNTER — Encounter: Payer: BC Managed Care – PPO | Admitting: Hematology and Oncology

## 2010-11-28 LAB — HOMOCYSTEINE: Homocysteine: 7.8 umol/L (ref 4.0–15.4)

## 2010-11-28 LAB — COMPREHENSIVE METABOLIC PANEL
ALT: 19 U/L (ref 0–35)
AST: 19 U/L (ref 0–37)
Alkaline Phosphatase: 82 U/L (ref 39–117)
Creatinine, Ser: 0.6 mg/dL (ref 0.50–1.10)
Sodium: 137 mEq/L (ref 135–145)
Total Bilirubin: 0.3 mg/dL (ref 0.3–1.2)
Total Protein: 7.3 g/dL (ref 6.0–8.3)

## 2010-11-28 LAB — FACTOR 5 LEIDEN

## 2010-11-28 LAB — BETA-2 GLYCOPROTEIN ANTIBODIES
Beta-2 Glyco I IgG: 0 G Units (ref <20)
Beta-2-Glycoprotein I IgA: 5 A Units (ref <20)
Beta-2-Glycoprotein I IgM: 4 M Units (ref <20)

## 2010-11-28 LAB — LUPUS ANTICOAGULANT PANEL: PTT Lupus Anticoagulant: 35.2 secs (ref 30.0–45.6)

## 2010-11-29 NOTE — H&P (Signed)
Holly Tanner, Holly Tanner               ACCOUNT NO.:  [id number]  MEDICAL RECORD NO.:  [id number]  LOCATION:  2012                         FACILITY:  [facility]  PHYSICIAN:  Gordy Savers, MDDATE OF BIRTH:  1970-11-25  DATE OF ADMISSION:  11/11/2010 DATE OF DISCHARGE:                             HISTORY & PHYSICAL   CHIEF COMPLAINT:  Right-sided chest pain.  HISTORY OF PRESENT ILLNESS:  The patient is a 40 year old patient who has a history of factor V Leiden mutation.  She was in her usual state of health until 4 days ago when she noted intermittent right-sided chest pain.  Pain was intermittently Thursday and Friday and became quite severe yesterday.  She was uncomfortable through the night and again awoke earlier today with chest pain.  Chest pain was positional and aggravated by lying on the right side and also by coughing and deep inspiration.  For the past 2 days, she has noted a slight increase in shortness of breath.  Due to the persistent and worsening chest pain, the patient presented to the emergency department.  A CT angiogram was positive for a pulmonary embolism and she is now admitted for further evaluation and management.  PAST MEDICAL HISTORY:  The patient has a history of factor V Leiden mutation and has been followed by Dr. Mickle Asper at [facility]. 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  She has a history of Crohn disease and required surgery in 2002 for a enterovesical fistula.  Dr. Kinnie Scales is her gastroenterologist and Dr. Darnell Level performed surgery in 2002.  She has been off any suppressant medications for Crohn's for 2 years.  She is a gravida 3, para 2, abortus 1.  SOCIAL HISTORY:  Married, 2 children.  Lifelong nonsmoker.  FAMILY HISTORY:  An identical twin sister also positive for factor V Leiden.  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  She has 1  sister and 1 brother who have not been tested for thrombophilia.  FAMILY HISTORY:  Mother age 35 has a history of inflammatory breast cancer but presently stable.  Father age 46 has atrial fibrillation status post ablation, also has a history of ulcerative colitis.  ALLERGIES:  TALWIN, VICODIN, SULFA, ERYTHROMYCIN, and PENICILLIN.  PRESENT MEDICATIONS:  P.r.n. Aleve only.  REVIEW OF SYSTEMS:  GENERAL:  Negative for fever, chills, or sweats.  No change in weight.  HEENT:  No headache, visual disturbances, vision or hearing loss.  SKIN:  No history of rash or skin lesions. CARDIOPULMONARY:  Positive for right-sided pleuritic chest pain and shortness of breath.  GENITOURINARY:  No urinary frequency, dysuria, urgency.  GI: Positive for Crohn disease.  She has mild chronic abdominal pain but has been stable off medications for 2 years.  No recent nausea, vomiting, or diarrhea.  ENDOCRINE:  No polyuria, polydipsia, heat or cold intolerance.  PHYSICAL EXAMINATION:  VITAL SIGNS:  Temperature 99.1, blood pressure 110/70, heart rate 90, respiratory rate 16-18, O2 saturation 96-98% on room air. GENERAL:  Revealed a well-developed healthy-appearing white female mildly overweight and in no distress, alert, cooperative. SKIN:  Warm and dry without rash. HEENT:  Revealed normal pupil responses.  Conjunctivae clear.  ENT normal. NECK:  Revealed no neck vein distention or adenopathy.  No thyroid enlargement. CHEST:  Revealed a few faint crackles involving the right posterior midlung not inconsistent with a rub.  Chest was clear anteriorly. CARDIOVASCULAR:  Revealed a normal S1, S2, rate was 80-90. GI:  Abdomen was mildly obese, soft, and nontender.  Bowel sounds active. EXTREMITIES:  Revealed no edema.  There is no calf tenderness.  Pedal pulses were full. SKIN:  No rashes or lesions. NEURO:  Revealed the patient to be alert and oriented.  Cranial nerve examination grossly intact.  Strength  normal.  Speech and sensation normal.  Chest x-ray revealed mild basilar opacities consistent with atelectasis. Chest CT angio was positive for PE with a clot involving the right main pulmonary artery.  Electrocardiogram was normal.  Laboratory studies included a D-dimer that was elevated at 2.54.  Chemistries were unremarkable.  BUN 9, creatinine 0.53.  Random blood sugar 93.  White count mildly elevated 11.3, hemoglobin 12.5, total platelet count 417,000.  IMPRESSION: 1. Acute pulmonary embolism. 2. History of factor V Leiden mutation and history of positive     antiphospholipid antibody  DISPOSITION:  The patient will be admitted to hospital.  She has IV heparin that was initiated in the ED setting.  This will be followed by pharmacy protocol and she will be transitioned to oral Coumadin anticoagulation.  We will obtain a lower extremity venous Doppler evaluation to help assess clot burden.  She will be admitted to a telemetry setting.     Gordy Savers, MD     PFK/MEDQ  D:  11/11/2010  T:  11/12/2010  Job:  [id number]  Electronically Signed by Eleonore Chiquito MD on 11/29/2010 02:31:52 PM

## 2010-11-30 ENCOUNTER — Ambulatory Visit (HOSPITAL_COMMUNITY)
Admission: RE | Admit: 2010-11-30 | Discharge: 2010-11-30 | Disposition: A | Discharge status: Home / Self Care | Payer: BC Managed Care – PPO | Source: Ambulatory Visit | Attending: Hematology and Oncology | Admitting: Hematology and Oncology

## 2010-11-30 ENCOUNTER — Encounter: Payer: BC Managed Care – PPO | Admitting: Hematology and Oncology

## 2010-11-30 DIAGNOSIS — N83209 Unspecified ovarian cyst, unspecified side: Secondary | ICD-10-CM | POA: Insufficient documentation

## 2010-11-30 DIAGNOSIS — K5090 Crohn's disease, unspecified, without complications: Secondary | ICD-10-CM

## 2010-11-30 DIAGNOSIS — K7689 Other specified diseases of liver: Secondary | ICD-10-CM | POA: Insufficient documentation

## 2010-11-30 DIAGNOSIS — R109 Unspecified abdominal pain: Secondary | ICD-10-CM | POA: Insufficient documentation

## 2010-11-30 MED ORDER — IOHEXOL 300 MG/ML  SOLN
100.0000 mL | Freq: Once | INTRAMUSCULAR | Status: AC | PRN
  Administered 2010-11-30: 100 mL via INTRAVENOUS

## 2010-12-20 NOTE — Discharge Summary (Signed)
Holly Tanner, Holly Tanner               ACCOUNT NO.:  [id number]  MEDICAL RECORD NO.:  [id number]  LOCATION:  2012                         FACILITY:  [facility]  PHYSICIAN:  Calvert Cantor, M.D.     DATE OF BIRTH:  Dec 01, 1970  DATE OF ADMISSION:  11/11/2010 DATE OF DISCHARGE:  11/14/2010                              DISCHARGE SUMMARY   PRIMARY CARE PHYSICIAN:  Tammy R. Collins Scotland, M.D. at the [facility].  PRESENTING COMPLAINT:  Right-sided chest pain.  DISCHARGE DIAGNOSES: 1. Pulmonary embolus with infarct. 2. Fatty infiltration of liver noted on CT.  PAST MEDICAL HISTORY: 1. Factor V Leiden mutation. 2. Antiphospholipid antibody, positive. 3. Crohn disease.  DISCHARGE MEDICATIONS: 1. Coumadin 7.5 mg tonight and tomorrow night followed by 5 mg daily.     The patient is going to have her INR checked in 2 days and Coumadin     should be adjusted appropriately. 2. Enoxaparin 85 mg twice a day. 3. Aleve 220 mg 1-2 tablets daily as needed for pain.  IMAGING RESULTS:  Chest x-ray two-view on November 11, 2010, revealed mild bibasilar opacities, suspect atelectasis.  CT scan of the chest with contrast on November 11, 2010, revealed pulmonary embolus in the right main pulmonary artery and descending interval arteries and branches.  Also noted were patchy bilateral air space opacities, most of them confluent in the anterior left upper lobe and right lung base.  There is also air space opacity in the periphery of the lingula, the right upper lobe opacity was thought to be secondary to an infarct, others were due to atelectasis.  HOSPITAL COURSE:  This is a 40 year old female with a hypercoagulable state who presented with right-sided chest pain and was found to have pulmonary embolus.  She was started on heparin and then eventually transitioned to Lovenox and Coumadin.  The patient is comfortable with taking Lovenox.  She has done it before and after her surgery.  She has never had a clot in the past  and being that she does have a hypercoagulable state, I suspect that she will need lifelong Coumadin. She does have a hematologist whom she will be following up for this.  On discharge today INR level is 1.51.  She has two more days of overlap between Lovenox and Coumadin.  If INR is still subtherapeutic beyond in those two days, she is to continue Lovenox until INR greater than 2.0 is reached.  The patient is no longer complaining of chest pain.  She is able to ambulate without shortness of breath.  Oxygen level at rest is 97% on room air.  With exertion her oxygen level was 92% on room air.  PHYSICAL EXAMINATION:  LUNGS:  Clear bilaterally. HEART:  Regular rate and rhythm.  No murmurs. ABDOMEN:  Soft, nontender, nondistended.  Bowel sounds positive. EXTREMITIES:  No cyanosis, clubbing, or edema.  Also bilateral lower extremity Dopplers were done and no DVT was found.  CONDITION ON DISCHARGE:  Stable.  FOLLOW UP:  With Dr. Dewain Penning.  The patient has an appointment for 2 days from now for an INR check.  Time on discharge was 45 minutes.     Calvert Cantor, M.D.  SR/MEDQ  D:  11/14/2010  T:  11/14/2010  Job:  [id number]  cc:   Tammy R. Collins Scotland, M.D.  Electronically Signed by Calvert Cantor M.D. on 12/20/2010 12:09:52 PM

## 2011-01-01 ENCOUNTER — Other Ambulatory Visit: Payer: Self-pay | Admitting: Hematology and Oncology

## 2011-01-01 ENCOUNTER — Encounter (HOSPITAL_BASED_OUTPATIENT_CLINIC_OR_DEPARTMENT_OTHER): Payer: BC Managed Care – PPO | Admitting: Hematology and Oncology

## 2011-01-01 DIAGNOSIS — D689 Coagulation defect, unspecified: Secondary | ICD-10-CM

## 2011-01-01 LAB — PROTIME-INR
INR: 1.8 — ABNORMAL LOW (ref 2.00–3.50)
Protime: 21.6 s — ABNORMAL HIGH (ref 10.6–13.4)

## 2011-01-01 LAB — CBC WITH DIFFERENTIAL/PLATELET
BASO%: 0.4 % (ref 0.0–2.0)
Eosinophils Absolute: 0.3 10*3/uL (ref 0.0–0.5)
MCV: 85.1 fL (ref 79.5–101.0)
MONO%: 7.9 % (ref 0.0–14.0)
NEUT#: 4.5 10*3/uL (ref 1.5–6.5)
RBC: 4.46 10*6/uL (ref 3.70–5.45)
RDW: 14.3 % (ref 11.2–14.5)
WBC: 7.9 10*3/uL (ref 3.9–10.3)

## 2011-01-11 ENCOUNTER — Ambulatory Visit (HOSPITAL_COMMUNITY)
Admission: RE | Admit: 2011-01-11 | Discharge: 2011-01-11 | Disposition: A | Discharge status: Home / Self Care | Payer: BC Managed Care – PPO | Source: Ambulatory Visit | Attending: Hematology and Oncology | Admitting: Hematology and Oncology

## 2011-01-11 DIAGNOSIS — K7689 Other specified diseases of liver: Secondary | ICD-10-CM | POA: Insufficient documentation

## 2011-01-11 DIAGNOSIS — R911 Solitary pulmonary nodule: Secondary | ICD-10-CM | POA: Insufficient documentation

## 2011-01-11 DIAGNOSIS — I2699 Other pulmonary embolism without acute cor pulmonale: Secondary | ICD-10-CM

## 2011-01-11 MED ORDER — IOHEXOL 300 MG/ML  SOLN
80.0000 mL | Freq: Once | INTRAMUSCULAR | Status: AC | PRN
  Administered 2011-01-11: 80 mL via INTRAVENOUS

## 2011-01-30 ENCOUNTER — Encounter (HOSPITAL_BASED_OUTPATIENT_CLINIC_OR_DEPARTMENT_OTHER): Payer: BC Managed Care – PPO | Admitting: Hematology and Oncology

## 2011-01-30 ENCOUNTER — Other Ambulatory Visit: Payer: Self-pay | Admitting: Hematology and Oncology

## 2011-01-30 DIAGNOSIS — R918 Other nonspecific abnormal finding of lung field: Secondary | ICD-10-CM

## 2011-01-30 DIAGNOSIS — D689 Coagulation defect, unspecified: Secondary | ICD-10-CM

## 2011-03-07 LAB — CBC
Hemoglobin: 11.1 g/dL — ABNORMAL LOW (ref 12.0–15.0)
MCHC: 33.9 g/dL (ref 30.0–36.0)
MCV: 90 fL (ref 78.0–100.0)
MCV: 90.4 fL (ref 78.0–100.0)
RBC: 3.62 MIL/uL — ABNORMAL LOW (ref 3.87–5.11)
RBC: 4.52 MIL/uL (ref 3.87–5.11)
RDW: 15.5 % (ref 11.5–15.5)
WBC: 12 10*3/uL — ABNORMAL HIGH (ref 4.0–10.5)

## 2011-03-07 LAB — TYPE AND SCREEN: Antibody Screen: NEGATIVE

## 2011-03-07 LAB — RPR: RPR Ser Ql: NONREACTIVE

## 2011-03-14 ENCOUNTER — Encounter: Payer: BC Managed Care – PPO | Admitting: *Deleted

## 2011-03-21 ENCOUNTER — Ambulatory Visit (INDEPENDENT_AMBULATORY_CARE_PROVIDER_SITE_OTHER): Payer: BC Managed Care – PPO | Admitting: *Deleted

## 2011-03-21 DIAGNOSIS — I2699 Other pulmonary embolism without acute cor pulmonale: Secondary | ICD-10-CM

## 2011-03-21 DIAGNOSIS — D6859 Other primary thrombophilia: Secondary | ICD-10-CM

## 2011-03-21 DIAGNOSIS — Z7901 Long term (current) use of anticoagulants: Secondary | ICD-10-CM

## 2011-03-21 LAB — POCT INR: INR: 1.6

## 2011-03-21 MED ORDER — WARFARIN SODIUM 10 MG PO TABS: 10.0000 mg | ORAL_TABLET | Freq: Every day | ORAL | Status: DC

## 2011-03-27 ENCOUNTER — Ambulatory Visit (INDEPENDENT_AMBULATORY_CARE_PROVIDER_SITE_OTHER): Payer: BC Managed Care – PPO | Admitting: *Deleted

## 2011-03-27 DIAGNOSIS — Z7901 Long term (current) use of anticoagulants: Secondary | ICD-10-CM

## 2011-03-27 DIAGNOSIS — I2699 Other pulmonary embolism without acute cor pulmonale: Secondary | ICD-10-CM

## 2011-03-27 DIAGNOSIS — D6859 Other primary thrombophilia: Secondary | ICD-10-CM

## 2011-03-27 LAB — POCT INR: INR: 1.4

## 2011-03-28 ENCOUNTER — Encounter: Payer: BC Managed Care – PPO | Admitting: *Deleted

## 2011-04-01 ENCOUNTER — Ambulatory Visit (INDEPENDENT_AMBULATORY_CARE_PROVIDER_SITE_OTHER): Payer: BC Managed Care – PPO | Admitting: *Deleted

## 2011-04-01 DIAGNOSIS — D6859 Other primary thrombophilia: Secondary | ICD-10-CM

## 2011-04-01 DIAGNOSIS — I2699 Other pulmonary embolism without acute cor pulmonale: Secondary | ICD-10-CM

## 2011-04-01 DIAGNOSIS — Z7901 Long term (current) use of anticoagulants: Secondary | ICD-10-CM

## 2011-04-08 ENCOUNTER — Ambulatory Visit (INDEPENDENT_AMBULATORY_CARE_PROVIDER_SITE_OTHER): Payer: BC Managed Care – PPO | Admitting: *Deleted

## 2011-04-08 DIAGNOSIS — Z7901 Long term (current) use of anticoagulants: Secondary | ICD-10-CM

## 2011-04-08 DIAGNOSIS — D6859 Other primary thrombophilia: Secondary | ICD-10-CM

## 2011-04-08 DIAGNOSIS — I2699 Other pulmonary embolism without acute cor pulmonale: Secondary | ICD-10-CM

## 2011-04-17 ENCOUNTER — Ambulatory Visit (INDEPENDENT_AMBULATORY_CARE_PROVIDER_SITE_OTHER): Payer: BC Managed Care – PPO | Admitting: *Deleted

## 2011-04-17 DIAGNOSIS — I2699 Other pulmonary embolism without acute cor pulmonale: Secondary | ICD-10-CM

## 2011-04-17 DIAGNOSIS — D6859 Other primary thrombophilia: Secondary | ICD-10-CM

## 2011-04-17 DIAGNOSIS — Z7901 Long term (current) use of anticoagulants: Secondary | ICD-10-CM

## 2011-04-17 LAB — POCT INR: INR: 2.6

## 2011-04-18 ENCOUNTER — Other Ambulatory Visit: Payer: BC Managed Care – PPO | Admitting: Lab

## 2011-04-19 ENCOUNTER — Other Ambulatory Visit (HOSPITAL_COMMUNITY): Payer: BC Managed Care – PPO

## 2011-04-19 ENCOUNTER — Other Ambulatory Visit: Payer: Self-pay | Admitting: Hematology and Oncology

## 2011-04-19 ENCOUNTER — Ambulatory Visit (HOSPITAL_COMMUNITY)
Admission: RE | Admit: 2011-04-19 | Discharge: 2011-04-19 | Disposition: A | Discharge status: Home / Self Care | Payer: BC Managed Care – PPO | Source: Ambulatory Visit | Attending: Hematology and Oncology | Admitting: Hematology and Oncology

## 2011-04-19 ENCOUNTER — Other Ambulatory Visit (HOSPITAL_BASED_OUTPATIENT_CLINIC_OR_DEPARTMENT_OTHER): Payer: BC Managed Care – PPO

## 2011-04-19 DIAGNOSIS — Z86718 Personal history of other venous thrombosis and embolism: Secondary | ICD-10-CM | POA: Insufficient documentation

## 2011-04-19 DIAGNOSIS — D689 Coagulation defect, unspecified: Secondary | ICD-10-CM

## 2011-04-19 DIAGNOSIS — R918 Other nonspecific abnormal finding of lung field: Secondary | ICD-10-CM | POA: Insufficient documentation

## 2011-04-19 DIAGNOSIS — K7689 Other specified diseases of liver: Secondary | ICD-10-CM | POA: Insufficient documentation

## 2011-04-19 LAB — CBC WITH DIFFERENTIAL/PLATELET
Basophils Absolute: 0.1 10*3/uL (ref 0.0–0.1)
HCT: 40.8 % (ref 34.8–46.6)
HGB: 13.5 g/dL (ref 11.6–15.9)
LYMPH%: 36 % (ref 14.0–49.7)
MCHC: 33.1 g/dL (ref 31.5–36.0)
MONO#: 0.4 10*3/uL (ref 0.1–0.9)
NEUT%: 54.4 % (ref 38.4–76.8)
Platelets: 305 10*3/uL (ref 145–400)
WBC: 7 10*3/uL (ref 3.9–10.3)
lymph#: 2.5 10*3/uL (ref 0.9–3.3)

## 2011-04-19 LAB — PROTIME-INR

## 2011-04-19 MED ORDER — IOHEXOL 300 MG/ML  SOLN
100.0000 mL | Freq: Once | INTRAMUSCULAR | Status: AC | PRN
  Administered 2011-04-19: 100 mL via INTRAVENOUS

## 2011-04-29 ENCOUNTER — Ambulatory Visit (INDEPENDENT_AMBULATORY_CARE_PROVIDER_SITE_OTHER): Payer: BC Managed Care – PPO | Admitting: *Deleted

## 2011-04-29 DIAGNOSIS — D6859 Other primary thrombophilia: Secondary | ICD-10-CM

## 2011-04-29 DIAGNOSIS — Z7901 Long term (current) use of anticoagulants: Secondary | ICD-10-CM

## 2011-04-29 DIAGNOSIS — I2699 Other pulmonary embolism without acute cor pulmonale: Secondary | ICD-10-CM

## 2011-04-29 LAB — POCT INR: INR: 2.1

## 2011-04-30 ENCOUNTER — Encounter: Payer: Self-pay | Admitting: *Deleted

## 2011-05-03 ENCOUNTER — Ambulatory Visit (HOSPITAL_BASED_OUTPATIENT_CLINIC_OR_DEPARTMENT_OTHER): Payer: BC Managed Care – PPO | Admitting: Hematology and Oncology

## 2011-05-03 ENCOUNTER — Telehealth: Payer: Self-pay | Admitting: *Deleted

## 2011-05-03 VITALS — BP 134/78 | HR 84 | Temp 97.5°F | Ht 64.5 in | Wt 190.1 lb

## 2011-05-03 DIAGNOSIS — Z7901 Long term (current) use of anticoagulants: Secondary | ICD-10-CM

## 2011-05-03 DIAGNOSIS — Z86711 Personal history of pulmonary embolism: Secondary | ICD-10-CM

## 2011-05-03 DIAGNOSIS — D6859 Other primary thrombophilia: Secondary | ICD-10-CM

## 2011-05-03 NOTE — Progress Notes (Signed)
CC:   Holly Tanner Scotland, M.D. Cherie Dark, MD Griffith Citron, M.D. Velora Heckler, MD Dineen Kid Rana Snare, M.D.  IDENTIFYING STATEMENT:  The patient is a 40 year old woman with factor V Leiden, antiphospholipid antibody syndrome and history of pulmonary embolism who presents for followup.  INTERVAL HISTORY:  [name] has had no major concerns since her last followup visit.  She specifically denies respiratory compliants. She has had no bleeding with Coumadin.  Her weight is stable.  She reports a recent colonoscopy.  I do not have those results but pt reports areas of inflammation that were chronic.  She is considering Remicade.   We reviewed CT angiogram of the chest on 04/09/2011.  This showed no evidence of PE.  There was no pleural or pericardial effusion.  There was no adenopathy. The 3 mm nodular opacity in the right upper lobe and a second 2 mm opacity in the right middle lobe remained unchanged.  Imaging of the upper abdomen showed fatty infiltration of the liver.  MEDICATIONS:  Reviewed and updated.  ALLERGIES:  Erythromycin, codeine sulfate, sulfa, pentazocine, naloxone, Penicillin, Vicodin.  PAST MEDICAL HISTORY/FAMILY HISTORY AND SOCIAL HISTORY:  Unchanged.  REVIEW OF SYSTEMS:  Ten point review of systems negative.  PHYSICAL EXAMINATION:  The patient is a well-appearing, well-nourished woman in no distress.  Vitals:  Pulse 84, blood pressure 134/78, temperature 97.5, respirations 20.  HEENT:  Head is atraumatic, normocephalic.  Sclerae anicteric.  Mouth moist.  Chest:  Clear. Cardiovascular:  Unremarkable.  Abdomen:  Soft, nontender.  Bowel sounds present.  Extremities:  No edema.  LABORATORY DATA:  04/19/2011 white cell count 7, hemoglobin 13.5, hematocrit 40.8, platelets 205.  INR 2.6.  Results of CTs are as in interval history.  IMPRESSION/PLAN:  [name] is a 40 year old woman with factor V Leiden antiphospholipid antibody syndrome and history of pulmonary embolism  who remains on indefinite anticoagulation.  She also has history of Crohn's disease.  A recent colonoscopy remains unchanged.  Patient's INR is within 2.5-3.  Her CT scans showed no new findings with the nodules following remaining unchanged.  So with this said, [name] will continue followup with plans to follow up in a year's time for continuing assessment.  At that time we will also repeat a CT scan.    ______________________________ Laurice Record, M.D. LIO/MEDQ  D:  05/03/2011  T:  05/03/2011  Job:  [id number]

## 2011-05-03 NOTE — Telephone Encounter (Signed)
gave patient appointment for 04-21-2012 with dr.odogwu at 9:30am gave patient appointment for ct chest on 04-17-2012 at 9:30am patient will do labs on the same day of scan

## 2011-05-03 NOTE — Progress Notes (Signed)
This office note has been dictated.

## 2011-05-13 ENCOUNTER — Ambulatory Visit (INDEPENDENT_AMBULATORY_CARE_PROVIDER_SITE_OTHER): Payer: BC Managed Care – PPO | Admitting: *Deleted

## 2011-05-13 DIAGNOSIS — I2699 Other pulmonary embolism without acute cor pulmonale: Secondary | ICD-10-CM

## 2011-05-13 DIAGNOSIS — Z7901 Long term (current) use of anticoagulants: Secondary | ICD-10-CM

## 2011-05-13 DIAGNOSIS — D6859 Other primary thrombophilia: Secondary | ICD-10-CM

## 2011-05-13 LAB — POCT INR: INR: 1.3

## 2011-05-22 ENCOUNTER — Ambulatory Visit (INDEPENDENT_AMBULATORY_CARE_PROVIDER_SITE_OTHER): Payer: BC Managed Care – PPO | Admitting: *Deleted

## 2011-05-22 DIAGNOSIS — D6859 Other primary thrombophilia: Secondary | ICD-10-CM

## 2011-05-22 DIAGNOSIS — Z7901 Long term (current) use of anticoagulants: Secondary | ICD-10-CM

## 2011-05-22 DIAGNOSIS — I2699 Other pulmonary embolism without acute cor pulmonale: Secondary | ICD-10-CM

## 2011-05-22 LAB — POCT INR: INR: 7.4

## 2011-05-22 NOTE — Patient Instructions (Addendum)
Discussed case with Weston Brass PharmD since pt has variable fluctuations in INRs and Dr Yehuda Budd had the same problems.  Pt also sees hematologist Dr. Dalene Carrow.  Kennon Rounds will call and discuss case with Dr Dalene Carrow for treatment options.

## 2011-05-23 ENCOUNTER — Telehealth: Payer: Self-pay | Admitting: *Deleted

## 2011-05-23 ENCOUNTER — Other Ambulatory Visit: Payer: Self-pay | Admitting: Cardiology

## 2011-05-23 ENCOUNTER — Ambulatory Visit (INDEPENDENT_AMBULATORY_CARE_PROVIDER_SITE_OTHER): Payer: Self-pay | Admitting: *Deleted

## 2011-05-23 DIAGNOSIS — R0989 Other specified symptoms and signs involving the circulatory and respiratory systems: Secondary | ICD-10-CM

## 2011-05-23 DIAGNOSIS — I2699 Other pulmonary embolism without acute cor pulmonale: Secondary | ICD-10-CM

## 2011-05-23 DIAGNOSIS — Z7901 Long term (current) use of anticoagulants: Secondary | ICD-10-CM

## 2011-05-23 DIAGNOSIS — D6859 Other primary thrombophilia: Secondary | ICD-10-CM

## 2011-05-23 LAB — PROTIME-INR: Prothrombin Time: 53.6 seconds — ABNORMAL HIGH (ref 11.6–15.2)

## 2011-05-23 LAB — POCT INR: INR: 5.79

## 2011-05-23 NOTE — Telephone Encounter (Signed)
Patient called about lab results.   Advised would ask lisa to call her back.  Called to [facility], requested lab results for INR drawn on 05/23/11, they did not have as stat.  Advised to make stat, need results.

## 2011-05-23 NOTE — Telephone Encounter (Signed)
See coumadin note. 

## 2011-05-29 ENCOUNTER — Ambulatory Visit (INDEPENDENT_AMBULATORY_CARE_PROVIDER_SITE_OTHER): Payer: BC Managed Care – PPO | Admitting: *Deleted

## 2011-05-29 DIAGNOSIS — I2699 Other pulmonary embolism without acute cor pulmonale: Secondary | ICD-10-CM

## 2011-05-29 DIAGNOSIS — Z7901 Long term (current) use of anticoagulants: Secondary | ICD-10-CM

## 2011-05-29 DIAGNOSIS — D6859 Other primary thrombophilia: Secondary | ICD-10-CM

## 2011-06-05 ENCOUNTER — Ambulatory Visit (INDEPENDENT_AMBULATORY_CARE_PROVIDER_SITE_OTHER): Payer: BC Managed Care – PPO | Admitting: *Deleted

## 2011-06-05 DIAGNOSIS — D6859 Other primary thrombophilia: Secondary | ICD-10-CM

## 2011-06-05 DIAGNOSIS — Z7901 Long term (current) use of anticoagulants: Secondary | ICD-10-CM

## 2011-06-05 DIAGNOSIS — I2699 Other pulmonary embolism without acute cor pulmonale: Secondary | ICD-10-CM

## 2011-06-12 ENCOUNTER — Ambulatory Visit (INDEPENDENT_AMBULATORY_CARE_PROVIDER_SITE_OTHER): Payer: BC Managed Care – PPO | Admitting: *Deleted

## 2011-06-12 DIAGNOSIS — I2699 Other pulmonary embolism without acute cor pulmonale: Secondary | ICD-10-CM

## 2011-06-12 DIAGNOSIS — D6859 Other primary thrombophilia: Secondary | ICD-10-CM

## 2011-06-12 DIAGNOSIS — Z7901 Long term (current) use of anticoagulants: Secondary | ICD-10-CM

## 2011-06-24 ENCOUNTER — Encounter: Payer: BC Managed Care – PPO | Admitting: *Deleted

## 2011-06-26 ENCOUNTER — Encounter: Payer: BC Managed Care – PPO | Admitting: *Deleted

## 2011-06-27 ENCOUNTER — Ambulatory Visit (INDEPENDENT_AMBULATORY_CARE_PROVIDER_SITE_OTHER): Payer: BC Managed Care – PPO | Admitting: *Deleted

## 2011-06-27 DIAGNOSIS — Z7901 Long term (current) use of anticoagulants: Secondary | ICD-10-CM

## 2011-06-27 DIAGNOSIS — I2699 Other pulmonary embolism without acute cor pulmonale: Secondary | ICD-10-CM

## 2011-06-27 DIAGNOSIS — D6859 Other primary thrombophilia: Secondary | ICD-10-CM

## 2011-07-11 ENCOUNTER — Ambulatory Visit (INDEPENDENT_AMBULATORY_CARE_PROVIDER_SITE_OTHER): Payer: BC Managed Care – PPO | Admitting: *Deleted

## 2011-07-11 DIAGNOSIS — D6859 Other primary thrombophilia: Secondary | ICD-10-CM

## 2011-07-11 DIAGNOSIS — I2699 Other pulmonary embolism without acute cor pulmonale: Secondary | ICD-10-CM

## 2011-07-11 DIAGNOSIS — Z7901 Long term (current) use of anticoagulants: Secondary | ICD-10-CM

## 2011-07-11 LAB — POCT INR: INR: 3.7

## 2011-07-25 ENCOUNTER — Ambulatory Visit (INDEPENDENT_AMBULATORY_CARE_PROVIDER_SITE_OTHER): Payer: BC Managed Care – PPO | Admitting: *Deleted

## 2011-07-25 DIAGNOSIS — Z7901 Long term (current) use of anticoagulants: Secondary | ICD-10-CM

## 2011-07-25 DIAGNOSIS — I2699 Other pulmonary embolism without acute cor pulmonale: Secondary | ICD-10-CM

## 2011-07-25 DIAGNOSIS — D6859 Other primary thrombophilia: Secondary | ICD-10-CM

## 2011-07-25 LAB — POCT INR: INR: 1.4

## 2011-09-19 ENCOUNTER — Telehealth: Payer: Self-pay | Admitting: *Deleted

## 2011-09-19 NOTE — Telephone Encounter (Signed)
Pt has not been in for coumadin check recently.  LMOM for pt to call office to schedule appt or let us know if coumadin is being managed by another provider.

## 2011-09-23 ENCOUNTER — Ambulatory Visit (INDEPENDENT_AMBULATORY_CARE_PROVIDER_SITE_OTHER): Payer: BC Managed Care – PPO | Admitting: *Deleted

## 2011-09-23 DIAGNOSIS — I2699 Other pulmonary embolism without acute cor pulmonale: Secondary | ICD-10-CM

## 2011-09-23 DIAGNOSIS — D6859 Other primary thrombophilia: Secondary | ICD-10-CM

## 2011-09-23 DIAGNOSIS — Z7901 Long term (current) use of anticoagulants: Secondary | ICD-10-CM

## 2011-09-23 LAB — POCT INR: INR: 2

## 2011-10-09 ENCOUNTER — Ambulatory Visit (INDEPENDENT_AMBULATORY_CARE_PROVIDER_SITE_OTHER): Payer: BC Managed Care – PPO | Admitting: *Deleted

## 2011-10-09 DIAGNOSIS — Z7901 Long term (current) use of anticoagulants: Secondary | ICD-10-CM

## 2011-10-09 DIAGNOSIS — I2699 Other pulmonary embolism without acute cor pulmonale: Secondary | ICD-10-CM

## 2011-10-09 DIAGNOSIS — D6859 Other primary thrombophilia: Secondary | ICD-10-CM

## 2011-10-09 LAB — POCT INR: INR: 3

## 2011-11-06 ENCOUNTER — Ambulatory Visit (INDEPENDENT_AMBULATORY_CARE_PROVIDER_SITE_OTHER): Payer: BC Managed Care – PPO | Admitting: *Deleted

## 2011-11-06 DIAGNOSIS — D6859 Other primary thrombophilia: Secondary | ICD-10-CM

## 2011-11-06 DIAGNOSIS — Z7901 Long term (current) use of anticoagulants: Secondary | ICD-10-CM

## 2011-11-06 DIAGNOSIS — I2699 Other pulmonary embolism without acute cor pulmonale: Secondary | ICD-10-CM

## 2011-11-06 LAB — POCT INR: INR: 3.5

## 2011-11-27 ENCOUNTER — Ambulatory Visit (INDEPENDENT_AMBULATORY_CARE_PROVIDER_SITE_OTHER): Payer: BC Managed Care – PPO | Admitting: *Deleted

## 2011-11-27 DIAGNOSIS — I2699 Other pulmonary embolism without acute cor pulmonale: Secondary | ICD-10-CM

## 2011-11-27 DIAGNOSIS — Z7901 Long term (current) use of anticoagulants: Secondary | ICD-10-CM

## 2011-11-27 DIAGNOSIS — D6859 Other primary thrombophilia: Secondary | ICD-10-CM

## 2011-11-27 LAB — POCT INR: INR: 2.5

## 2011-12-12 ENCOUNTER — Telehealth: Payer: Self-pay | Admitting: *Deleted

## 2011-12-12 NOTE — Telephone Encounter (Signed)
PT NEED WARFARIN CALLED  IN

## 2011-12-13 ENCOUNTER — Other Ambulatory Visit: Payer: Self-pay | Admitting: *Deleted

## 2011-12-13 MED ORDER — WARFARIN SODIUM 10 MG PO TABS: 10.0000 mg | ORAL_TABLET | Freq: Every day | ORAL | Status: DC

## 2011-12-13 NOTE — Telephone Encounter (Signed)
Sent Rx 

## 2011-12-25 ENCOUNTER — Ambulatory Visit (INDEPENDENT_AMBULATORY_CARE_PROVIDER_SITE_OTHER): Payer: BC Managed Care – PPO | Admitting: *Deleted

## 2011-12-25 DIAGNOSIS — I2699 Other pulmonary embolism without acute cor pulmonale: Secondary | ICD-10-CM

## 2011-12-25 DIAGNOSIS — Z7901 Long term (current) use of anticoagulants: Secondary | ICD-10-CM

## 2011-12-25 DIAGNOSIS — D6859 Other primary thrombophilia: Secondary | ICD-10-CM

## 2012-01-22 ENCOUNTER — Ambulatory Visit (INDEPENDENT_AMBULATORY_CARE_PROVIDER_SITE_OTHER): Payer: BC Managed Care – PPO | Admitting: *Deleted

## 2012-01-22 DIAGNOSIS — Z7901 Long term (current) use of anticoagulants: Secondary | ICD-10-CM

## 2012-01-22 DIAGNOSIS — I2699 Other pulmonary embolism without acute cor pulmonale: Secondary | ICD-10-CM

## 2012-01-22 DIAGNOSIS — D6859 Other primary thrombophilia: Secondary | ICD-10-CM

## 2012-02-26 ENCOUNTER — Ambulatory Visit (INDEPENDENT_AMBULATORY_CARE_PROVIDER_SITE_OTHER): Payer: BC Managed Care – PPO | Admitting: *Deleted

## 2012-02-26 DIAGNOSIS — Z7901 Long term (current) use of anticoagulants: Secondary | ICD-10-CM

## 2012-02-26 DIAGNOSIS — I2699 Other pulmonary embolism without acute cor pulmonale: Secondary | ICD-10-CM

## 2012-02-26 DIAGNOSIS — D6859 Other primary thrombophilia: Secondary | ICD-10-CM

## 2012-02-26 LAB — POCT INR: INR: 3.1

## 2012-03-30 ENCOUNTER — Ambulatory Visit (INDEPENDENT_AMBULATORY_CARE_PROVIDER_SITE_OTHER): Payer: BC Managed Care – PPO | Admitting: *Deleted

## 2012-03-30 DIAGNOSIS — Z7901 Long term (current) use of anticoagulants: Secondary | ICD-10-CM

## 2012-03-30 DIAGNOSIS — D6859 Other primary thrombophilia: Secondary | ICD-10-CM

## 2012-03-30 DIAGNOSIS — I2699 Other pulmonary embolism without acute cor pulmonale: Secondary | ICD-10-CM

## 2012-04-13 ENCOUNTER — Ambulatory Visit (INDEPENDENT_AMBULATORY_CARE_PROVIDER_SITE_OTHER): Payer: BC Managed Care – PPO | Admitting: *Deleted

## 2012-04-13 ENCOUNTER — Telehealth: Payer: Self-pay | Admitting: Hematology and Oncology

## 2012-04-13 DIAGNOSIS — D6859 Other primary thrombophilia: Secondary | ICD-10-CM

## 2012-04-13 DIAGNOSIS — Z7901 Long term (current) use of anticoagulants: Secondary | ICD-10-CM

## 2012-04-13 DIAGNOSIS — I2699 Other pulmonary embolism without acute cor pulmonale: Secondary | ICD-10-CM

## 2012-04-13 NOTE — Telephone Encounter (Signed)
Pt lmonvm today stating that she apparently has a ct on this Friday that she will not be able to keep. Returned call and lmonvm for pt that she has lb/ct 11/15 and LO 11/19 and that appts were scheduled last November. Pt asked to call me if she cannot keep the appts.

## 2012-04-15 ENCOUNTER — Telehealth: Payer: Self-pay | Admitting: Hematology and Oncology

## 2012-04-15 NOTE — Telephone Encounter (Signed)
Returned pt's call re cx'd appts for lb/ct and f/u. Per pt she her grandma is sick - she has board meetings and November and December is just not a good time for her so she will have to r/s. Message forwarded to desk nurse for new f/u appt after November/December and the lb/ct will be rescheduled according. lmonvm for pt informing her that she will be contacted w/new appts.

## 2012-04-17 ENCOUNTER — Other Ambulatory Visit: Payer: BC Managed Care – PPO | Admitting: Lab

## 2012-04-17 ENCOUNTER — Ambulatory Visit (HOSPITAL_COMMUNITY): Payer: BC Managed Care – PPO

## 2012-04-20 ENCOUNTER — Other Ambulatory Visit: Payer: Self-pay | Admitting: Hematology and Oncology

## 2012-04-20 ENCOUNTER — Telehealth: Payer: Self-pay | Admitting: Hematology and Oncology

## 2012-04-20 NOTE — Telephone Encounter (Signed)
Per 11/18 pof r/s f/u visit (11/19) to 07/01/12 w/lbs a few days b4. lmonvm for pt re new d/t for lb 06/29/12 and f/u 07/01/12. Pt also has a ct order and made aware via vm that central would call w/ct appt. January 2014 schedule mailed.

## 2012-04-21 ENCOUNTER — Ambulatory Visit: Payer: BC Managed Care – PPO | Admitting: Hematology and Oncology

## 2012-04-22 ENCOUNTER — Ambulatory Visit (INDEPENDENT_AMBULATORY_CARE_PROVIDER_SITE_OTHER): Payer: BC Managed Care – PPO | Admitting: *Deleted

## 2012-04-22 DIAGNOSIS — I2699 Other pulmonary embolism without acute cor pulmonale: Secondary | ICD-10-CM

## 2012-04-22 DIAGNOSIS — Z7901 Long term (current) use of anticoagulants: Secondary | ICD-10-CM

## 2012-04-22 DIAGNOSIS — D6859 Other primary thrombophilia: Secondary | ICD-10-CM

## 2012-05-07 ENCOUNTER — Ambulatory Visit (INDEPENDENT_AMBULATORY_CARE_PROVIDER_SITE_OTHER): Payer: BC Managed Care – PPO | Admitting: *Deleted

## 2012-05-07 DIAGNOSIS — D6859 Other primary thrombophilia: Secondary | ICD-10-CM

## 2012-05-07 DIAGNOSIS — I2699 Other pulmonary embolism without acute cor pulmonale: Secondary | ICD-10-CM

## 2012-05-07 DIAGNOSIS — Z7901 Long term (current) use of anticoagulants: Secondary | ICD-10-CM

## 2012-05-07 LAB — POCT INR: INR: 3.2

## 2012-05-25 ENCOUNTER — Telehealth: Payer: Self-pay | Admitting: Oncology

## 2012-05-25 NOTE — Telephone Encounter (Signed)
Called pt and left message regarding rescheduling appt from Dr. Odogwu to Dr. Granfortuna , waiting for a call back °

## 2012-06-06 ENCOUNTER — Encounter: Payer: Self-pay | Admitting: *Deleted

## 2012-06-08 ENCOUNTER — Ambulatory Visit (INDEPENDENT_AMBULATORY_CARE_PROVIDER_SITE_OTHER): Payer: BC Managed Care – PPO | Admitting: *Deleted

## 2012-06-08 DIAGNOSIS — I2699 Other pulmonary embolism without acute cor pulmonale: Secondary | ICD-10-CM

## 2012-06-08 DIAGNOSIS — D6859 Other primary thrombophilia: Secondary | ICD-10-CM

## 2012-06-08 DIAGNOSIS — Z7901 Long term (current) use of anticoagulants: Secondary | ICD-10-CM

## 2012-06-22 ENCOUNTER — Other Ambulatory Visit: Payer: Self-pay | Admitting: Oncology

## 2012-06-29 ENCOUNTER — Telehealth: Payer: Self-pay | Admitting: Oncology

## 2012-06-29 ENCOUNTER — Other Ambulatory Visit: Payer: BC Managed Care – PPO | Admitting: Lab

## 2012-06-29 NOTE — Telephone Encounter (Signed)
Former pt of LO reassigned to [facility]. Pt lmonvm 1/23 to cx 1/27 and 1/29 appts. Returned call and lmonvm for pt informing her that appts have been cx'd and asked that she call us back if she wishes to r/s. Pt made aware that appts will not be r/s'd unless we hear from her.

## 2012-07-01 ENCOUNTER — Ambulatory Visit: Payer: BC Managed Care – PPO | Admitting: Nurse Practitioner

## 2012-07-01 ENCOUNTER — Ambulatory Visit: Payer: BC Managed Care – PPO | Admitting: Hematology and Oncology

## 2012-07-08 ENCOUNTER — Ambulatory Visit (INDEPENDENT_AMBULATORY_CARE_PROVIDER_SITE_OTHER): Payer: BC Managed Care – PPO | Admitting: *Deleted

## 2012-07-08 DIAGNOSIS — D6859 Other primary thrombophilia: Secondary | ICD-10-CM

## 2012-07-08 DIAGNOSIS — I2699 Other pulmonary embolism without acute cor pulmonale: Secondary | ICD-10-CM

## 2012-07-08 DIAGNOSIS — Z7901 Long term (current) use of anticoagulants: Secondary | ICD-10-CM

## 2012-07-23 IMAGING — CR DG CHEST 2V
2 series · 2 of 2 positions shown · non-contrast
Comparison: None

CLINICAL DATA: Chest pain

CHEST - 2 VIEW

[w chest pa]
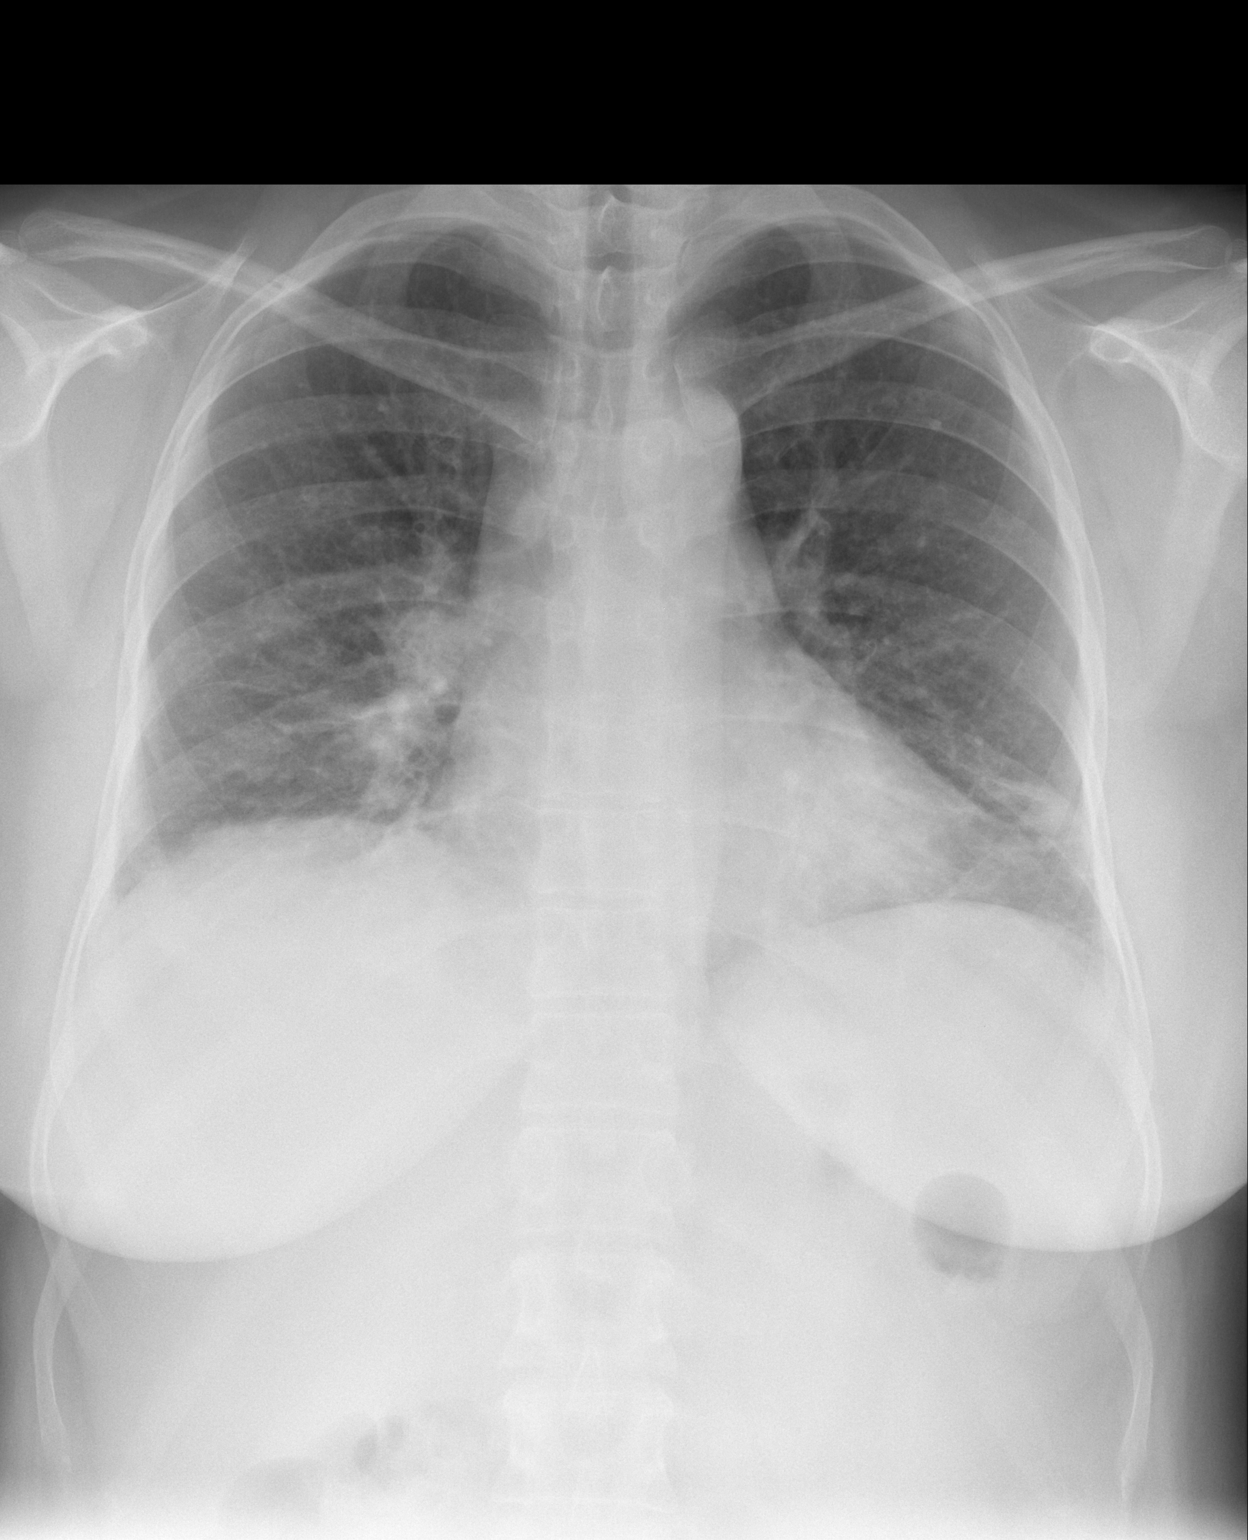

[w chest lat]
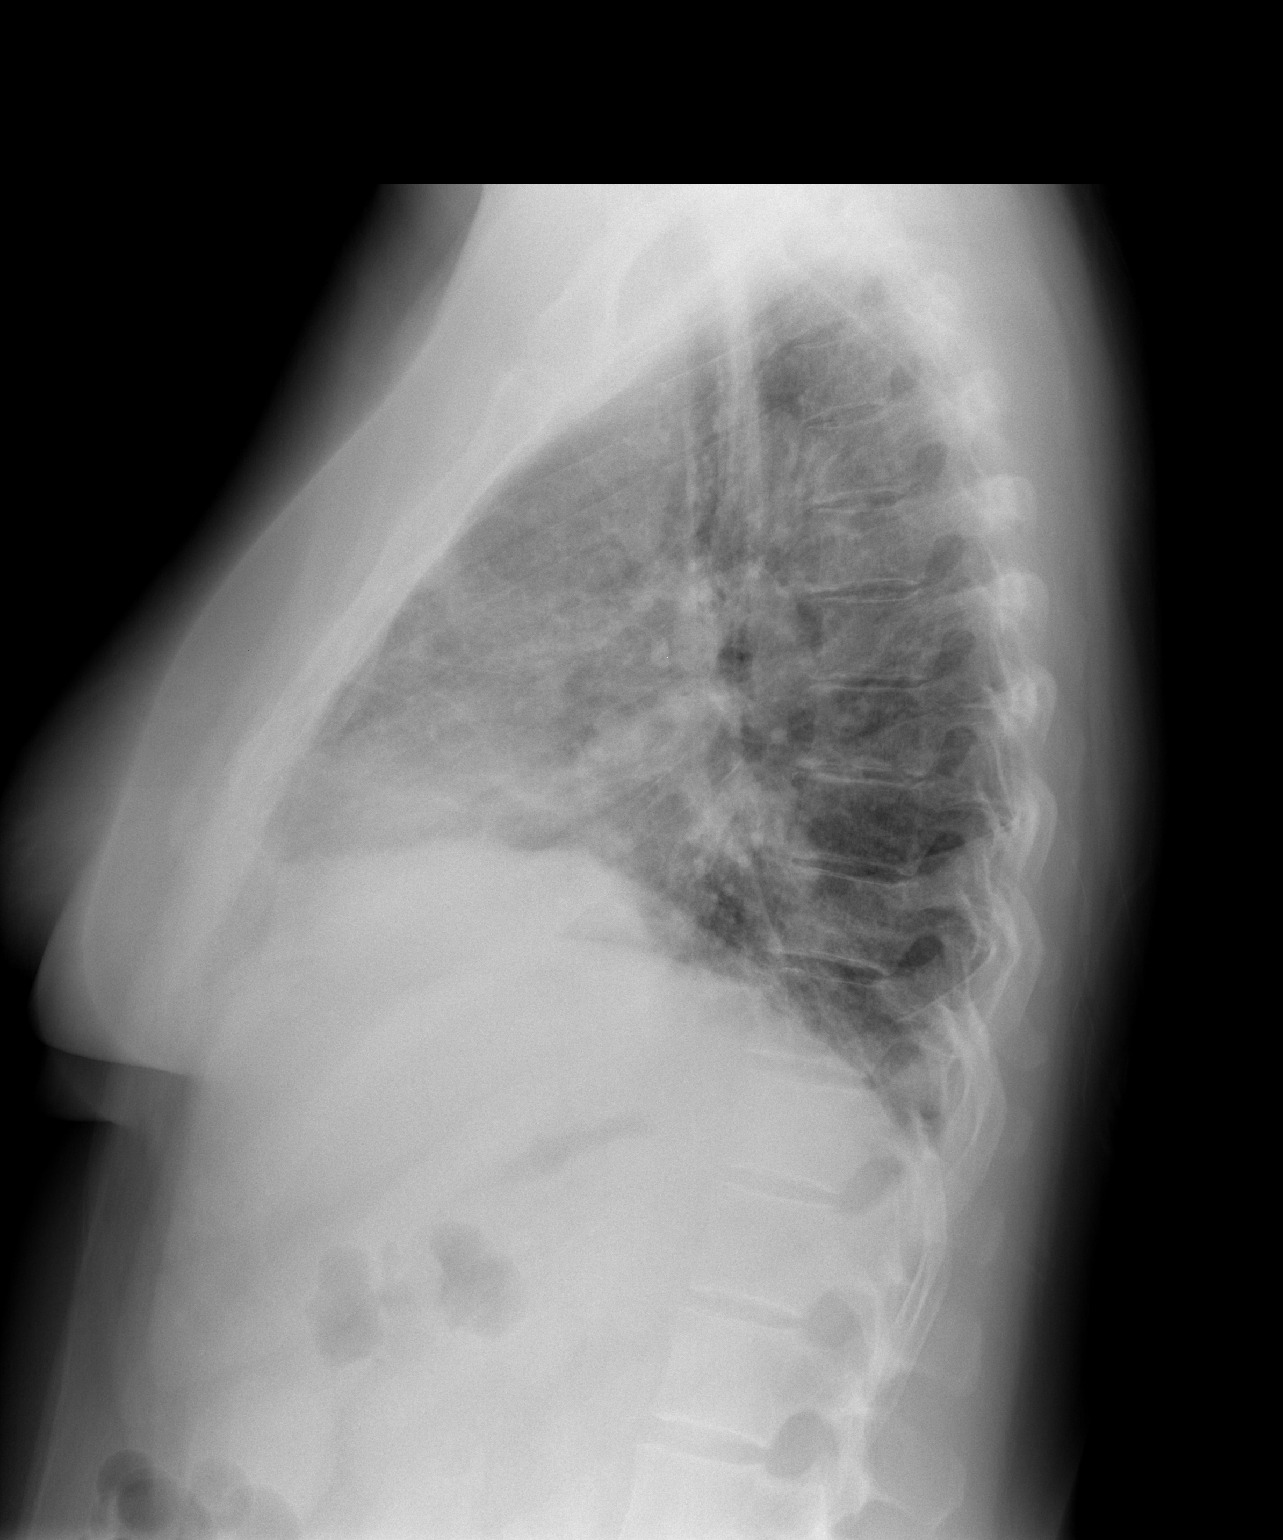

[2 of 2 positions shown; findings below may reference images not displayed]

FINDINGS: The cardiomediastinal silhouette is unremarkable.
Mild bibasilar atelectasis versus airspace disease noted.
There is no evidence of pleural effusion, pulmonary edema or
pneumothorax.
No acute or suspicious bony abnormalities are identified.
IMPRESSION: Mild bibasilar opacities - suspect atelectasis. Pneumonia is not
entirely excluded however.

## 2012-07-29 ENCOUNTER — Ambulatory Visit (INDEPENDENT_AMBULATORY_CARE_PROVIDER_SITE_OTHER): Payer: BC Managed Care – PPO | Admitting: *Deleted

## 2012-07-29 DIAGNOSIS — D6859 Other primary thrombophilia: Secondary | ICD-10-CM

## 2012-07-29 DIAGNOSIS — Z7901 Long term (current) use of anticoagulants: Secondary | ICD-10-CM

## 2012-07-29 DIAGNOSIS — I2699 Other pulmonary embolism without acute cor pulmonale: Secondary | ICD-10-CM

## 2012-07-29 LAB — POCT INR: INR: 2.6

## 2012-08-05 ENCOUNTER — Telehealth: Payer: Self-pay | Admitting: Oncology

## 2012-08-05 NOTE — Telephone Encounter (Signed)
Former pt of LO reassigned to [facility]. Pt called today to r/s January 2014 appts that she cx'd. Next avail is June and pt will be out of town 6/15 trhu 6/30. Pt given appt for lb/JG 7/1. Pt also wanted to know if she should have ct scheduled November 2013 prior to seeing JG. Message to [facility] re ct.

## 2012-08-26 ENCOUNTER — Ambulatory Visit (INDEPENDENT_AMBULATORY_CARE_PROVIDER_SITE_OTHER): Payer: BC Managed Care – PPO | Admitting: *Deleted

## 2012-08-26 DIAGNOSIS — Z7901 Long term (current) use of anticoagulants: Secondary | ICD-10-CM

## 2012-08-26 DIAGNOSIS — D6859 Other primary thrombophilia: Secondary | ICD-10-CM

## 2012-08-26 DIAGNOSIS — I2699 Other pulmonary embolism without acute cor pulmonale: Secondary | ICD-10-CM

## 2012-08-26 LAB — POCT INR: INR: 2.9

## 2012-09-12 ENCOUNTER — Other Ambulatory Visit: Payer: Self-pay | Admitting: Oncology

## 2012-09-12 DIAGNOSIS — Z7901 Long term (current) use of anticoagulants: Secondary | ICD-10-CM

## 2012-09-12 DIAGNOSIS — D6859 Other primary thrombophilia: Secondary | ICD-10-CM

## 2012-09-12 DIAGNOSIS — R918 Other nonspecific abnormal finding of lung field: Secondary | ICD-10-CM

## 2012-09-21 ENCOUNTER — Telehealth: Payer: Self-pay | Admitting: *Deleted

## 2012-09-21 NOTE — Telephone Encounter (Signed)
Per Dr. Cyndie Chime; called and left mess on cell# to call office ----re: per Dr. Cyndie Chime wants to inform pt that MD will get CT chest 1 week prior MD appt in July.

## 2012-10-07 ENCOUNTER — Ambulatory Visit (INDEPENDENT_AMBULATORY_CARE_PROVIDER_SITE_OTHER): Payer: BC Managed Care – PPO | Admitting: *Deleted

## 2012-10-07 DIAGNOSIS — Z7901 Long term (current) use of anticoagulants: Secondary | ICD-10-CM

## 2012-10-07 DIAGNOSIS — D6859 Other primary thrombophilia: Secondary | ICD-10-CM

## 2012-10-07 DIAGNOSIS — I2699 Other pulmonary embolism without acute cor pulmonale: Secondary | ICD-10-CM

## 2012-10-07 LAB — POCT INR: INR: 2.3

## 2012-10-20 ENCOUNTER — Telehealth: Payer: Self-pay | Admitting: Oncology

## 2012-11-04 ENCOUNTER — Ambulatory Visit (INDEPENDENT_AMBULATORY_CARE_PROVIDER_SITE_OTHER): Payer: BC Managed Care – PPO | Admitting: *Deleted

## 2012-11-04 DIAGNOSIS — D6859 Other primary thrombophilia: Secondary | ICD-10-CM

## 2012-11-04 DIAGNOSIS — Z7901 Long term (current) use of anticoagulants: Secondary | ICD-10-CM

## 2012-11-04 DIAGNOSIS — I2699 Other pulmonary embolism without acute cor pulmonale: Secondary | ICD-10-CM

## 2012-11-04 MED ORDER — WARFARIN SODIUM 10 MG PO TABS: ORAL_TABLET | ORAL | Status: DC

## 2012-11-16 ENCOUNTER — Ambulatory Visit (HOSPITAL_COMMUNITY)
Admission: RE | Admit: 2012-11-16 | Discharge: 2012-11-16 | Disposition: A | Discharge status: Home / Self Care | Payer: BC Managed Care – PPO | Source: Ambulatory Visit | Attending: Oncology | Admitting: Oncology

## 2012-11-16 ENCOUNTER — Other Ambulatory Visit (HOSPITAL_BASED_OUTPATIENT_CLINIC_OR_DEPARTMENT_OTHER): Payer: BC Managed Care – PPO

## 2012-11-16 DIAGNOSIS — K7689 Other specified diseases of liver: Secondary | ICD-10-CM | POA: Insufficient documentation

## 2012-11-16 DIAGNOSIS — R918 Other nonspecific abnormal finding of lung field: Secondary | ICD-10-CM | POA: Insufficient documentation

## 2012-11-16 DIAGNOSIS — D6859 Other primary thrombophilia: Secondary | ICD-10-CM

## 2012-11-16 LAB — PROTIME-INR
INR: 2.3 (ref 2.00–3.50)
Protime: 27.6 Seconds — ABNORMAL HIGH (ref 10.6–13.4)

## 2012-11-16 LAB — COMPREHENSIVE METABOLIC PANEL (CC13)
AST: 34 U/L (ref 5–34)
Alkaline Phosphatase: 83 U/L (ref 40–150)
BUN: 7.9 mg/dL (ref 7.0–26.0)
Calcium: 8.4 mg/dL (ref 8.4–10.4)
Chloride: 104 mEq/L (ref 98–107)
Creatinine: 0.7 mg/dL (ref 0.6–1.1)

## 2012-11-16 LAB — CBC WITH DIFFERENTIAL/PLATELET
EOS%: 2.6 % (ref 0.0–7.0)
MCH: 27.7 pg (ref 25.1–34.0)
MCHC: 33.1 g/dL (ref 31.5–36.0)
MCV: 83.7 fL (ref 79.5–101.0)
MONO%: 6.8 % (ref 0.0–14.0)
RBC: 4.51 10*6/uL (ref 3.70–5.45)
RDW: 14.8 % — ABNORMAL HIGH (ref 11.2–14.5)

## 2012-11-16 MED ORDER — IOHEXOL 300 MG/ML  SOLN
80.0000 mL | Freq: Once | INTRAMUSCULAR | Status: AC | PRN
  Administered 2012-11-16: 80 mL via INTRAVENOUS

## 2012-11-24 ENCOUNTER — Other Ambulatory Visit (HOSPITAL_COMMUNITY): Payer: BC Managed Care – PPO

## 2012-11-24 ENCOUNTER — Other Ambulatory Visit: Payer: BC Managed Care – PPO | Admitting: Lab

## 2012-11-26 ENCOUNTER — Telehealth: Payer: Self-pay | Admitting: *Deleted

## 2012-11-26 NOTE — Telephone Encounter (Signed)
Pt notified of CT results per Dr Granfortuna's instructions.  

## 2012-11-26 NOTE — Telephone Encounter (Signed)
Message copied by Sabino Snipes on Thu Nov 26, 2012  9:25 AM ------      Message from: Levert Feinstein      Created: Tue Nov 24, 2012  2:19 PM       Call patient - CT stable - no new findings  (Odogwu patient who I have never seen) ------

## 2012-12-01 ENCOUNTER — Other Ambulatory Visit: Payer: BC Managed Care – PPO | Admitting: Lab

## 2012-12-01 ENCOUNTER — Ambulatory Visit (HOSPITAL_BASED_OUTPATIENT_CLINIC_OR_DEPARTMENT_OTHER): Payer: BC Managed Care – PPO | Admitting: Oncology

## 2012-12-01 ENCOUNTER — Telehealth: Payer: Self-pay | Admitting: Oncology

## 2012-12-01 VITALS — BP 124/76 | HR 91 | Temp 97.8°F | Resp 20 | Ht 64.5 in | Wt 199.2 lb

## 2012-12-01 DIAGNOSIS — I2699 Other pulmonary embolism without acute cor pulmonale: Secondary | ICD-10-CM

## 2012-12-01 DIAGNOSIS — D6859 Other primary thrombophilia: Secondary | ICD-10-CM

## 2012-12-01 DIAGNOSIS — Z7901 Long term (current) use of anticoagulants: Secondary | ICD-10-CM

## 2012-12-01 NOTE — Progress Notes (Signed)
Hematology and Oncology Follow Up Visit  Holly Tanner [id number] Oct 06, 1970 42 y.o. 12/01/2012 5:47 PM   Principle Diagnosis: Encounter Diagnoses  Name Primary?  . Other pulmonary embolism and infarction Yes  . Encounter for long-term (current) use of anticoagulants   . 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 She presented in June 2012 with the indolent onset of intermittent pain in her right pectoral area. When she went for a evaluation on 11/11/2010, she was found to have sustained pulmonary emboli with clot seen in the right main pulmonary artery and descending intralobar pulmonary arteries and their branches. Incidentally noted on the scan was fatty infiltration of the liver. She was started on anticoagulation and remains on Coumadin current dose 10 mg daily except 5 mg on Tuesdays Thursdays and Saturdays.  Of note is the fact that she has an identical twin sister who suffered postpartum thrombotic events after each of 2 pregnancies with DVT and pulmonary emboli. She was found to be a carrier for the factor V Leiden gene mutation and had elevated antiphospholipid antibodies. 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 She was also told that she had elevated antiphospholipid antibodies. However, only record in [place] of studies done 11/23/2010 shows only an isolated elevation of IgA anticardiolipin 37 units normal less than 22 with normal IgG and IgM values, no elevation of antibodies to beta 2 glycoprotein 1 in any antibody class, and a negative lupus anticoagulant test.  She has 2 sons:  age 82 who has not been tested, and age 66-1/2. The 65 and a half-year-old was found to have a ASD. In anticipation of possible surgery he was tested and is a Leiden heterozygote. Her husband was diagnosed with an ASD at age 87. He has not been tested for factor V Leiden.  She  has another sister and brother who have not had any clotting problems. Her mother was diagnosed with a inflammatory breast cancer 12 years ago and remains in remission after treatment. Her father has refractory atrial fibrillation and has had to have DC cardioversion on a number of occasions and was recently told he had a "clot in his heart".  Past medical history otherwise remarkable for Crohn's colitis diagnosed in 2000. She has loose bowel movements usually just postprandial. She has never had any hematochezia. She was on steroids in the past but they caused more symptoms than the colitis and she stopped them. She is controlling her symptoms nicely with diet alone. She is followed by Dr. Kinnie Scales, gastroenterology.  She has no signs or symptoms of a collagen vascular disorder and specifically denies any polyarthralgia, polymyalgia, excessive hair loss, skin rash or sensitivity.  Medications: reviewed  Allergies:  Allergies  Allergen Reactions  . Codeine Hives  . Erythromycin Hives  . Penicillins Anaphylaxis  . Pentazocine Lactate Hives  . Sulfa Antibiotics Hives  . Vicodin (Hydrocodone-Acetaminophen) Hives    Review of Systems: Constitutional:   No constitutional symptoms Respiratory: No cough or dyspnea Cardiovascular:  No chest pain or palpitations Gastrointestinal: See above Genito-Urinary: Still menstruating Musculoskeletal: See above Neurologic: No headache or change in vision. Occasional paresthesias of her fingers. Skin: See above Remaining ROS negative.  Physical Exam: Blood pressure 124/76, pulse 91, temperature 97.8 F (36.6 C), temperature source Oral, resp. rate 20, height 5' 4.5" (1.638 m), weight 199 lb 3.2 oz (90.357 kg), last menstrual  period 10/16/2012. Wt Readings from Last 3 Encounters:  12/01/12 199 lb 3.2 oz (90.357 kg)  05/03/11 190 lb 1.6 oz (86.229 kg)  01/30/11 194 lb 3.2 oz (88.089 kg)     General appearance: Well-nourished Caucasian woman HENNT:  Pharynx no erythema or exudate Lymph nodes: No adenopathy Breasts: Lungs: Clear to auscultation resonant to percussion Heart: Regular rhythm no murmur Abdomen: Soft, nontender, no mass, no organomegaly Extremities: No edema, no calf tenderness Musculoskeletal: No joint deformities GU: Vascular: No carotid bruits, no calf tenderness Neurologic: Mental status intact, PERRLA, optic discs sharp vessels normal, motor strength 5 over 5, reflexes 1+ symmetric, sensation intact to vibration over the fingertips Skin: No rash or ecchymosis  Lab Results: Lab Results  Component Value Date   WBC 9.9 11/16/2012   HGB 12.5 11/16/2012   HCT 37.8 11/16/2012   MCV 83.7 11/16/2012   PLT 320 11/16/2012     Chemistry      Component Value Date/Time   NA 137 11/16/2012 0908   NA 137 11/23/2010 1119   K 3.6 11/16/2012 0908   K 4.3 11/23/2010 1119   CL 104 11/16/2012 0908   CL 104 11/23/2010 1119   CO2 23 11/16/2012 0908   CO2 20 11/23/2010 1119   BUN 7.9 11/16/2012 0908   BUN 13 11/23/2010 1119   CREATININE 0.7 11/16/2012 0908   CREATININE 0.60 11/23/2010 1119      Component Value Date/Time   CALCIUM 8.4 11/16/2012 0908   CALCIUM 9.5 11/23/2010 1119   ALKPHOS 83 11/16/2012 0908   ALKPHOS 82 11/23/2010 1119   AST 34 11/16/2012 0908   AST 19 11/23/2010 1119   ALT 34 11/16/2012 0908   ALT 19 11/23/2010 1119   BILITOT 0.24 11/16/2012 0908   BILITOT 0.3 11/23/2010 1119       Radiological Studies: Ct Chest W Contrast  11/16/2012   *RADIOLOGY REPORT*  Clinical Data: Follow up indeterminate pulmonary nodules.  CT CHEST WITH CONTRAST  Technique:  Multidetector CT imaging of the chest was performed following the standard protocol during bolus administration of intravenous contrast.  Contrast: 80mL OMNIPAQUE IOHEXOL 300 MG/ML  SOLN  Comparison: 04/19/2011 and 11/11/2010  Findings: A few tiny 2-3 mm pulmonary nodules are again seen which show no significant change, consistent with benign etiology.  No new or enlarging  pulmonary nodules masses are identified.  There is no evidence of pulmonary infiltrate or central endobronchial lesion.  No evidence of pleural or pericardial effusion.  No evidence of hilar or mediastinal lymphadenopathy.  No adenopathy seen elsewhere within the thorax.  No evidence of chest wall mass or suspicious bone lesions.  Hepatic steatosis again noted as well as a small cyst in the central right hepatic lobe.  IMPRESSION:  1.  Stable tiny pulmonary nodules, consistent with benign etiology. No acute findings or active disease within the thorax. 2.  Stable hepatic steatosis.   Original Report Authenticated By: Myles Rosenthal, M.D.    Impression: #1. Coagulopathy due to heterozygote status for the factor V Leiden gene mutation with stated history of accompanying antiphospholipid antibody elevation.  In reviewing laboratory testing done here at time of her pulmonary embolus in June 2012, there is no evidence that she has elevation of antiphospholipid antibodies. In addition, the test that she was also told was positive, the factor V Leiden gene mutation, is listed as not done due to insufficient sample.  In view of above, I do not believe that she should be given the diagnosis of  antiphospholipid antibody syndrome. I'm going to repeat beta 2 glycoprotein 1 and a lupus anticoagulant test just to make sure but these were never elevated when first tested in 2012. We need to confirm that she is in fact a heterozygote for the 5 Leiden gene mutation.  All the above have implications with respect to long-term use of anticoagulants in this lady. At this point I don't think she needs to be on long-term Coumadin. I proposed to her that we stop her Coumadin if I confirmed negative antiphospholipid antibodies and just maintain her on 81 mg of aspirin daily.  #2. Crohn's colitis. So far her disease has been mild. Inflammatory bowel disease is a risk factor for thrombosis but only seems to cause problems when the  disease is in active phase.  #3. Unprovoked pulmonary embolus June 2012 secondary to #1 See discussion above.   CC:. Dr. Maxwell Marion; Dr. Sharrell Ku; Dr. Candice Camp   Levert Feinstein, MD 7/1/20145:47 PM

## 2012-12-01 NOTE — Telephone Encounter (Signed)
gave pt appt for january 2015 and July 2014 , printed AVS

## 2012-12-14 ENCOUNTER — Other Ambulatory Visit: Payer: BC Managed Care – PPO | Admitting: Lab

## 2012-12-14 DIAGNOSIS — D6859 Other primary thrombophilia: Secondary | ICD-10-CM

## 2012-12-14 DIAGNOSIS — I2699 Other pulmonary embolism without acute cor pulmonale: Secondary | ICD-10-CM

## 2012-12-14 DIAGNOSIS — Z7901 Long term (current) use of anticoagulants: Secondary | ICD-10-CM

## 2012-12-14 LAB — PROTHROMBIN TIME: INR: 1.02 (ref <1.50)

## 2012-12-17 LAB — LUPUS ANTICOAGULANT PANEL: PTT Lupus Anticoagulant: 30.8 secs (ref 28.0–43.0)

## 2012-12-17 LAB — BETA-2 GLYCOPROTEIN ANTIBODIES: Beta-2-Glycoprotein I IgM: 0 M Units (ref <20)

## 2012-12-21 ENCOUNTER — Telehealth: Payer: Self-pay | Admitting: *Deleted

## 2012-12-21 NOTE — Telephone Encounter (Signed)
Spoke with patient.  Let her know that antibodies to clotting factors were not detected.  She is asking if she should stop the coumadin and go on the baby asprin.  Will clarify with Dr. Cyndie Chime and let her know.  She requests that we leave a message on her answering machine if she does not answer the phone.  [phone] call back number.

## 2012-12-21 NOTE — Telephone Encounter (Signed)
Message copied by Orbie Hurst on Mon Dec 21, 2012  4:57 PM ------      Message from: Levert Feinstein      Created: Sun Dec 20, 2012  1:40 PM       Call pt: antibodies to clotting factors not detected ------

## 2012-12-22 ENCOUNTER — Other Ambulatory Visit: Payer: Self-pay | Admitting: Oncology

## 2012-12-22 DIAGNOSIS — I2699 Other pulmonary embolism without acute cor pulmonale: Secondary | ICD-10-CM

## 2012-12-23 ENCOUNTER — Telehealth: Payer: Self-pay | Admitting: *Deleted

## 2012-12-23 MED ORDER — ASPIRIN 81 MG PO TABS: 81.0000 mg | ORAL_TABLET | Freq: Every day | ORAL | Status: DC

## 2012-12-23 NOTE — Telephone Encounter (Signed)
Message copied by Orbie Hurst on Wed Dec 23, 2012  9:50 AM ------      Message from: Levert Feinstein      Created: Sun Dec 20, 2012  1:40 PM       Call pt: antibodies to clotting factors not detected ------

## 2012-12-23 NOTE — Telephone Encounter (Signed)
Called patient back with further instructions per Dr. Cyndie Chime.  He would like her to stop the coumadin and begin the "baby" asprin @ 81 mg daily.  Dr. Cyndie Chime will ask her to return 01/05/13 for labs.  She was told she was positive for factor V Leiden, however Dr. Cyndie Chime cannot find any record of this.  She appreciated the call back.

## 2012-12-24 ENCOUNTER — Telehealth: Payer: Self-pay | Admitting: Oncology

## 2012-12-24 NOTE — Telephone Encounter (Signed)
lvm for pt regarding to 8.5. lab,,.,mailed pt avs.

## 2012-12-29 IMAGING — CT CT ANGIO CHEST
2 of 6 series · 19 of 36 positions shown · IV contrast (APPLIED)
Comparison: CT chest 11/11/2010 and 01/11/2011.

CLINICAL DATA: History of pulmonary embolus and pulmonary nodules.

CT ANGIOGRAPHY CHEST WITH CONTRAST
TECHNIQUE: Multidetector CT imaging of the chest was performed
using the standard protocol during bolus administration of
intravenous contrast.  Multiplanar CT image reconstructions
including MIPs were obtained to evaluate the vascular anatomy.
Contrast: 100mL OMNIPAQUE IOHEXOL 300 MG/ML IV SOLN

[Series 6: pe thins @ 1mm · axial · 0.74mm/px · z∈[-280,-60]mm · 18 of 246 slices shown]
[im 13/246  lung]
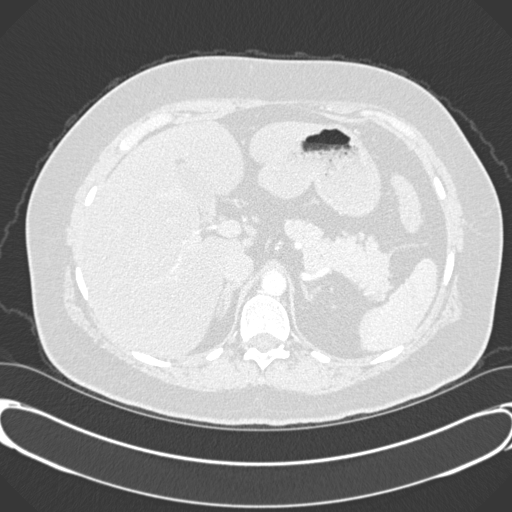
[im 25/246  mediastinal]
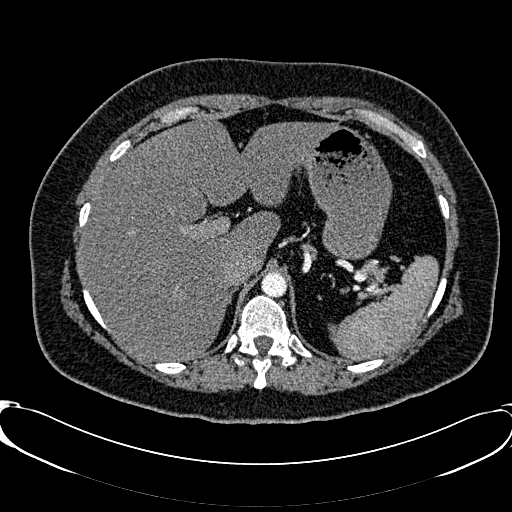
[im 37/246  lung]
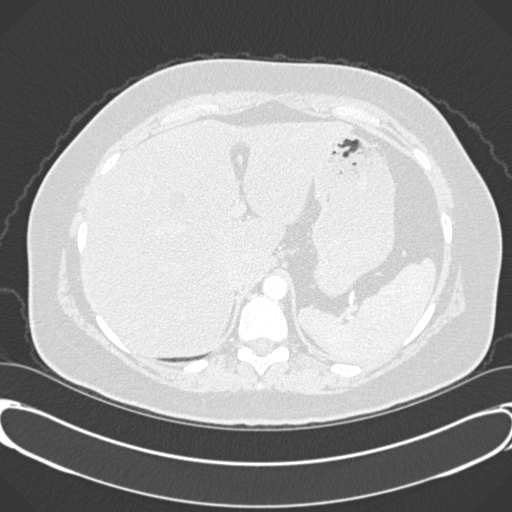
[im 50/246  mediastinal]
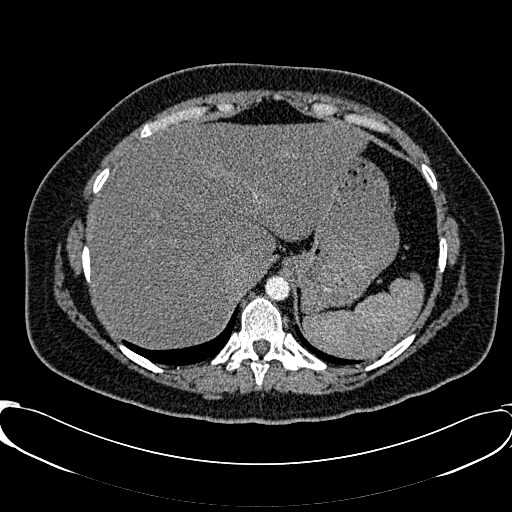
[im 62/246  lung]
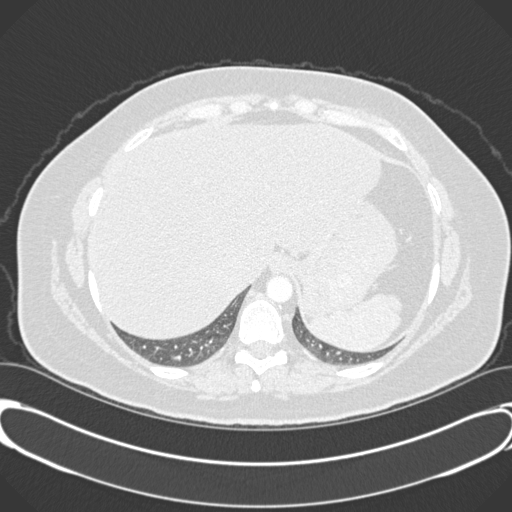
[im 74/246  mediastinal]
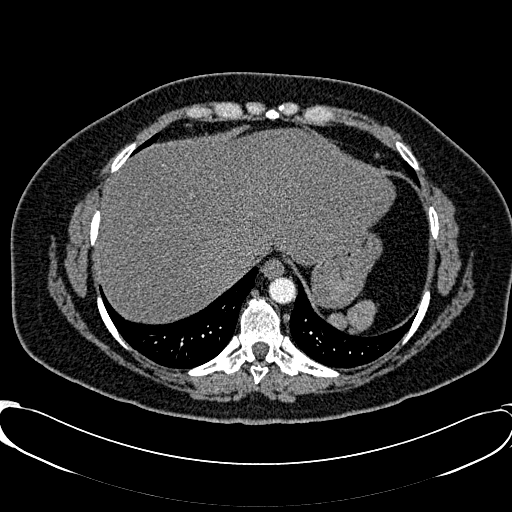
[im 86/246  lung]
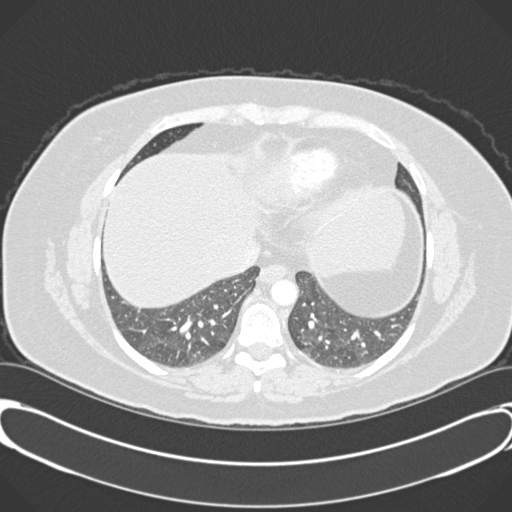
[im 99/246  mediastinal]
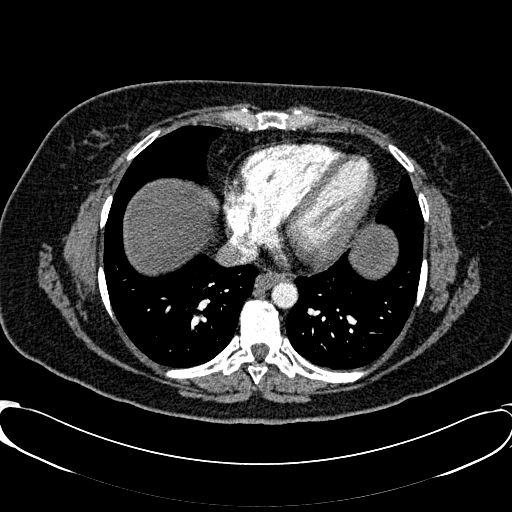
[im 111/246  lung]
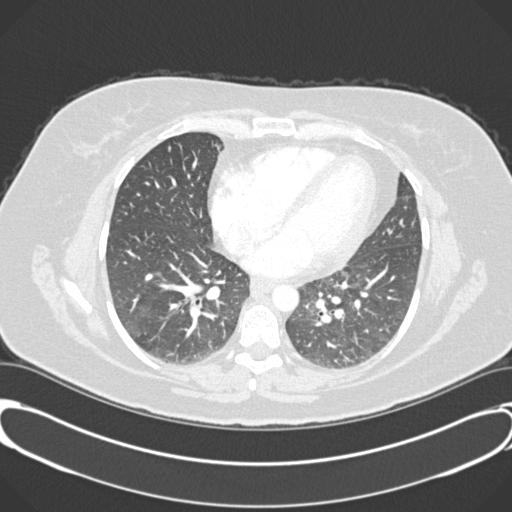
[im 135/246  mediastinal]
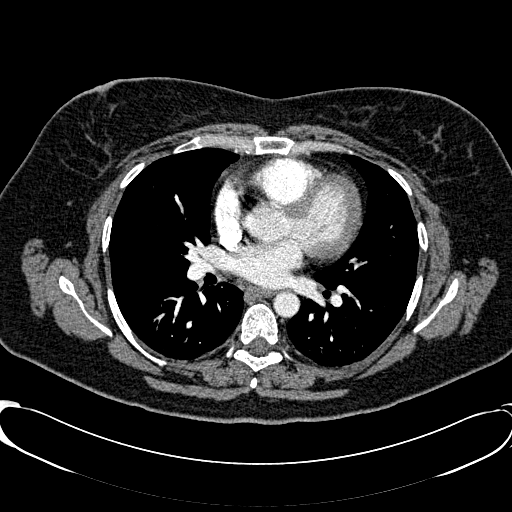
[im 148/246  lung]
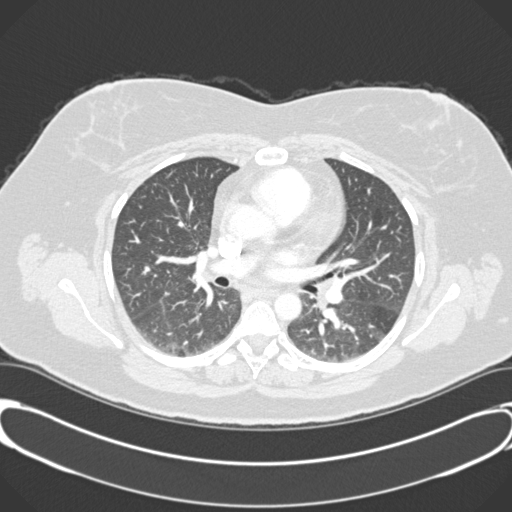
[im 160/246  mediastinal]
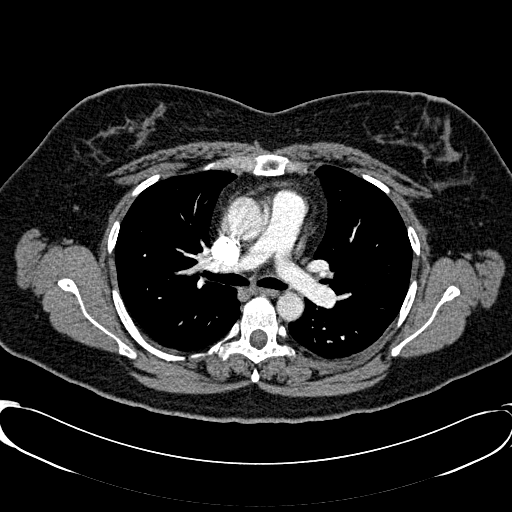
[im 172/246  lung]
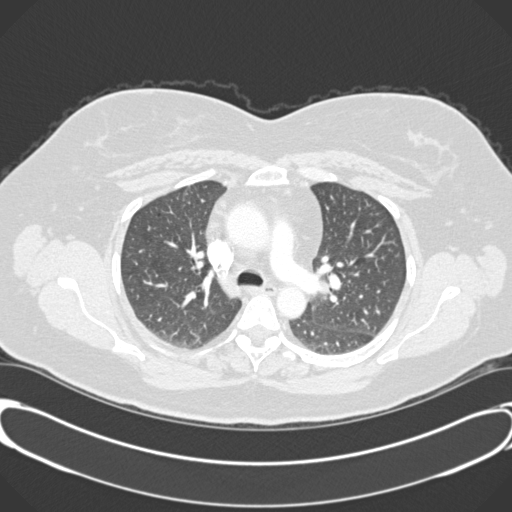
[im 184/246  mediastinal]
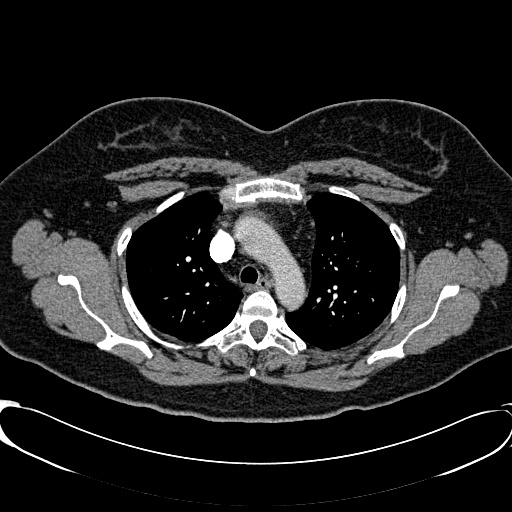
[im 197/246  lung]
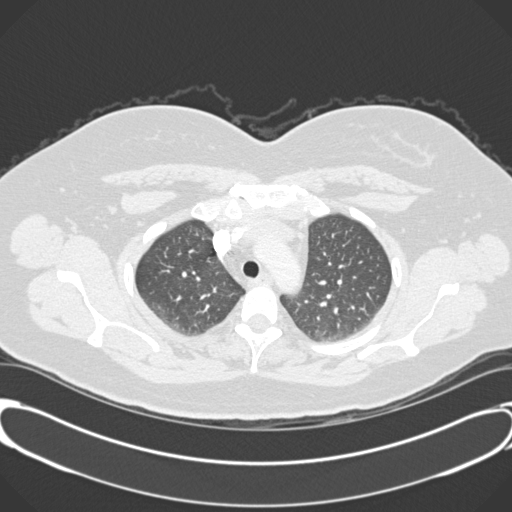
[im 209/246  mediastinal]
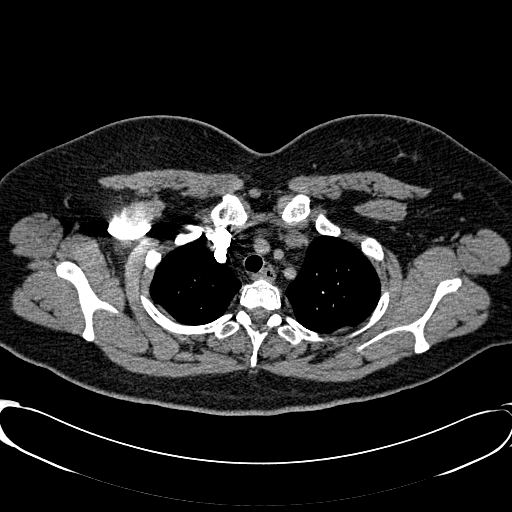
[im 221/246  lung]
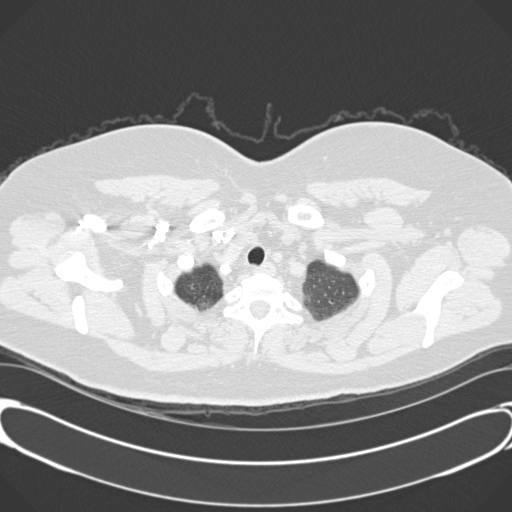
[im 233/246  mediastinal]
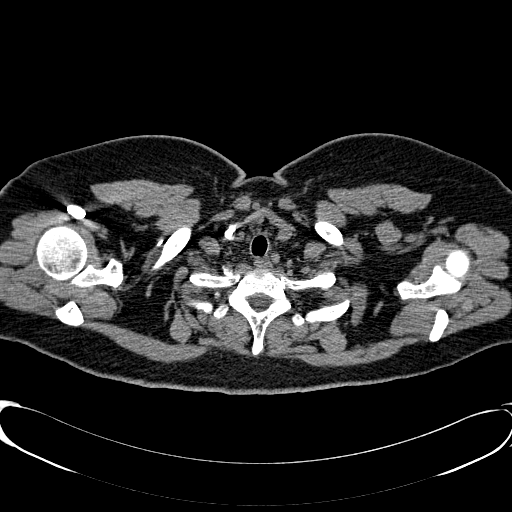

[Series 602: <mpr thick range> · coronal · 0.74mm/px · 1 of 117 slices shown]
[im 59/117  mediastinal]
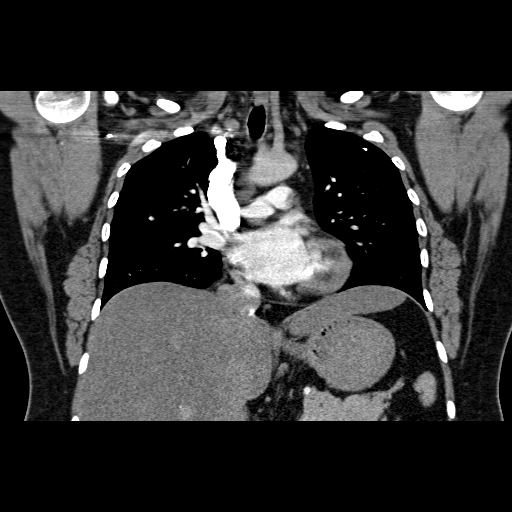

[19 of 36 positions shown; findings below may reference images not displayed]

FINDINGS: Small linear defect is seen in a segmental branch to the
right lower lobe consistent with a pulmonary arterial web.  Finding
is best demonstrated on images 139 142 of series 6.  No new
pulmonary embolus is identified.

There is no pleural or pericardial effusion.  No axillary, hilar or
mediastinal lymphadenopathy.  Heart size is normal.

Again seen is a 3 mm nodular opacity on image 38 in the right upper
lobe.  A second 2 mm nodular opacity in the right middle lobe seen
on the prior study is also unchanged.  Lungs also demonstrate some
mild dependent atelectasis.

Incidentally imaged upper abdomen shows fatty infiltration of the
liver.  Visualized intra-abdominal contents are otherwise
unremarkable.  No focal bony abnormality.

Review of the MIP images confirms the above findings.
IMPRESSION: 1.  Negative for pulmonary embolus.  Small pulmonary arterial web
in the right lower lobe is unchanged.
2.  Unchanged 2-3 mm right middle and upper lobe pulmonary nodules.
If the patient is at high risk for bronchogenic carcinoma, follow-
up chest CT at 1 year is recommended.  If the patient is at low
risk, no follow-up is needed.  This recommendation follows the
consensus statement: Guidelines for Management of Small Pulmonary
Nodules Detected on CT Scans:  A Statement from the [employer] [facility]
online at:  [URL]
3.  Fatty infiltration of the liver.

## 2013-01-01 ENCOUNTER — Telehealth: Payer: Self-pay | Admitting: Oncology

## 2013-01-01 NOTE — Telephone Encounter (Signed)
Returned pt's call re moving 8/5 lb appt to 8/6 or 8/7. appt moved to 8/7 @ 11:15am. lmonvm for pt w/new lb d/t.

## 2013-01-05 ENCOUNTER — Other Ambulatory Visit: Payer: BC Managed Care – PPO

## 2013-01-07 ENCOUNTER — Other Ambulatory Visit: Payer: BC Managed Care – PPO

## 2013-01-07 ENCOUNTER — Telehealth: Payer: Self-pay | Admitting: Oncology

## 2013-01-07 NOTE — Telephone Encounter (Signed)
pt lvm that her Anette Guarneri is now in hospice and she has to go when she gets back she will call to r/s

## 2013-02-18 ENCOUNTER — Ambulatory Visit: Payer: Self-pay | Admitting: *Deleted

## 2013-02-18 DIAGNOSIS — I2699 Other pulmonary embolism without acute cor pulmonale: Secondary | ICD-10-CM

## 2013-02-18 DIAGNOSIS — Z7901 Long term (current) use of anticoagulants: Secondary | ICD-10-CM

## 2013-02-18 DIAGNOSIS — D6859 Other primary thrombophilia: Secondary | ICD-10-CM

## 2013-06-07 ENCOUNTER — Telehealth: Payer: Self-pay | Admitting: Oncology

## 2013-06-07 ENCOUNTER — Ambulatory Visit (HOSPITAL_BASED_OUTPATIENT_CLINIC_OR_DEPARTMENT_OTHER): Payer: BC Managed Care – PPO | Admitting: Oncology

## 2013-06-07 ENCOUNTER — Ambulatory Visit: Payer: BC Managed Care – PPO | Admitting: Oncology

## 2013-06-07 ENCOUNTER — Ambulatory Visit (HOSPITAL_BASED_OUTPATIENT_CLINIC_OR_DEPARTMENT_OTHER): Payer: BC Managed Care – PPO

## 2013-06-07 VITALS — BP 121/52 | HR 82 | Temp 97.6°F | Resp 19 | Ht 64.5 in | Wt 200.1 lb

## 2013-06-07 DIAGNOSIS — I2699 Other pulmonary embolism without acute cor pulmonale: Secondary | ICD-10-CM

## 2013-06-07 DIAGNOSIS — Z7901 Long term (current) use of anticoagulants: Secondary | ICD-10-CM

## 2013-06-07 DIAGNOSIS — Z7982 Long term (current) use of aspirin: Secondary | ICD-10-CM

## 2013-06-07 DIAGNOSIS — Z86718 Personal history of other venous thrombosis and embolism: Secondary | ICD-10-CM

## 2013-06-07 DIAGNOSIS — D6859 Other primary thrombophilia: Secondary | ICD-10-CM

## 2013-06-07 DIAGNOSIS — K5010 Crohn's disease of large intestine without complications: Secondary | ICD-10-CM

## 2013-06-07 NOTE — Progress Notes (Signed)
Hematology and Oncology Follow Up Visit  Holly Tanner [id number] 22-May-1971 43 y.o. 06/07/2013 2:07 PM   Principle Diagnosis: Encounter Diagnoses  Name Primary?  . Other pulmonary embolism and infarction Yes  . Encounter for long-term (current) use of anticoagulants   . 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 She was found to be a heterozygote for the factor V Leiden gene mutation. She has an identical twin sister who had postpartum thrombotic events after each of 2 pregnancies with DVT and pulmonary emboli and was the index person found to be a Leiden heterozygote. Holly Tanner was first tested at [facility] in 2002. She was also told that she had elevated antiphospholipid antibodies. I have not been able to substantiate this. I repeated beta 2 glycoprotein 1 antibodies and a lupus anticoagulant test in July 2014 at time of her first visit here and both tests were negative. In fact antibody levels were 0. Additional risk factor for thrombosis is a history of Crohn's colitis which is currently inactive and not on any medication. Subsequent to getting laboratory results back in July, I told her she could stop her Coumadin and just go on aspirin 81 mg daily.  She has not had any interim problems. She denies any dyspnea, chest pain, or palpitations.  She has 2 boys one age 45 with a history of ASD who was checked and is a Leiden carrier. Her other son is age 45. He is healthy. He has not been checked in.   Medications: reviewed  Allergies:  Allergies  Allergen Reactions  . Codeine Hives  . Erythromycin Hives  . Penicillins Anaphylaxis  . Pentazocine Lactate Hives  . Sulfa Antibiotics Hives  . Vicodin [Hydrocodone-Acetaminophen] Hives  . Shellfish Allergy Rash    Takes benadryl    Review of Systems: Hematology: No bleeding or bruising ENT  ROS: No sore throat Breast ROS:  Respiratory ROS: See above Cardiovascular ROS:  See above Gastrointestinal ROS: No abdominal pain or change in bowel habit. No recent labs of her colitis. Genito-Urinary ROS: No urinary tract symptoms Musculoskeletal ROS no muscle bone or joint pain Neurological ROS: No headache or change in vision Dermatological ROS: No rash Remaining ROS negative  Physical Exam: Blood pressure 121/52, pulse 82, temperature 97.6 F (36.4 C), temperature source Oral, resp. rate 19, height 5' 4.5" (1.638 m), weight 200 lb 1.6 oz (90.765 kg). Wt Readings from Last 3 Encounters:  06/07/13 200 lb 1.6 oz (90.765 kg)  12/01/12 199 lb 3.2 oz (90.357 kg)  05/03/11 190 lb 1.6 oz (86.229 kg)     General appearance: well  nourished caucasian woman HENNT: Pharynx no erythema, exudate, mass, or ulcer. No thyromegaly or thyroid nodules Lymph nodes: No cervical, supraclavicular, or axillary lymphadenopathy Breasts:  Lungs: Clear to auscultation, resonant to percussion throughout Heart: Regular rhythm, no murmur, no gallop, no rub, no click, no edema Abdomen: Soft, nontender, normal bowel sounds, no mass, no organomegaly Extremities: No edema, no calf tenderness Musculoskeletal: no joint deformities GU:  Vascular: Carotid pulses 2+, no bruits, Neurologic: Alert, oriented, , cranial nerves grossly normal, motor strength 5 over 5, reflexes 1+ symmetric,  Skin: No rash or ecchymosis  Lab Results: CBC W/Diff    Component Value Date/Time   WBC 9.9 11/16/2012 0909   WBC 9.0 11/14/2010 0248   RBC 4.51 11/16/2012 0909   RBC  4.25 11/14/2010 0248   HGB 12.5 11/16/2012 0909   HGB 12.0 11/14/2010 0248   HCT 37.8 11/16/2012 0909   HCT 36.4 11/14/2010 0248   PLT 320 11/16/2012 0909   PLT 324 11/14/2010 0248   MCV 83.7 11/16/2012 0909   MCV 85.6 11/14/2010 0248   MCH 27.7 11/16/2012 0909   MCH 28.2 11/14/2010 0248   MCHC 33.1 11/16/2012 0909   MCHC 33.0 11/14/2010 0248   RDW 14.8* 11/16/2012  0909   RDW 12.9 11/14/2010 0248   LYMPHSABS 2.5 11/16/2012 0909   LYMPHSABS 3.1 11/11/2010 1623   MONOABS 0.7 11/16/2012 0909   MONOABS 1.0 11/11/2010 1623   EOSABS 0.3 11/16/2012 0909   EOSABS 0.3 11/11/2010 1623   BASOSABS 0.1 11/16/2012 0909   BASOSABS 0.1 11/11/2010 1623     Chemistry      Component Value Date/Time   NA 137 11/16/2012 0908   NA 137 11/23/2010 1119   K 3.6 11/16/2012 0908   K 4.3 11/23/2010 1119   CL 104 11/16/2012 0908   CL 104 11/23/2010 1119   CO2 23 11/16/2012 0908   CO2 20 11/23/2010 1119   BUN 7.9 11/16/2012 0908   BUN 13 11/23/2010 1119   CREATININE 0.7 11/16/2012 0908   CREATININE 0.60 11/23/2010 1119      Component Value Date/Time   CALCIUM 8.4 11/16/2012 0908   CALCIUM 9.5 11/23/2010 1119   ALKPHOS 83 11/16/2012 0908   ALKPHOS 82 11/23/2010 1119   AST 34 11/16/2012 0908   AST 19 11/23/2010 1119   ALT 34 11/16/2012 0908   ALT 19 11/23/2010 1119   BILITOT 0.24 11/16/2012 0908   BILITOT 0.3 11/23/2010 1119         Impression:  #1. Coagulopathy secondary to heterozygote status for factor V Leiden gene mutation  #2. Unprovoked pulmonary embolus June 2012 likely related to above. She was fully anticoagulated for 2 years. I recommended changing to aspirin alone in July 2014. We again reviewed high risk situations where she would need to be back on anticoagulation including surgery, immobilization, very long airplane trips, or flareups of her Crohn's disease.  #3. Crohn's colitis currently not active  I will see her again on an as-needed basis.    CC: Patient Care Team: Augustina Mood as PCP - General   Levert Feinstein, MD 1/5/20152:07 PM

## 2013-06-07 NOTE — Telephone Encounter (Signed)
PT WAS ALREADY SENT BACK TO [facility] TODAY. PER 06/07/13 POF RETURN PRN W/JG @ [facility].

## 2013-06-07 NOTE — Telephone Encounter (Signed)
Sent pt to labs today

## 2013-06-09 LAB — FACTOR 5 LEIDEN

## 2013-06-09 LAB — PROTHROMBIN GENE MUTATION

## 2013-06-17 ENCOUNTER — Telehealth: Payer: Self-pay | Admitting: *Deleted

## 2013-06-17 NOTE — Telephone Encounter (Signed)
Per Dr. Cyndie ChimeGranfortuna; notified pt lab tests confirmed she is a heterozygote for factor V Leiden gene mutation; negative for prothrombin gene mutation.  Pt verbalized understanding and copy of reports mailed to pt.

## 2013-06-17 NOTE — Telephone Encounter (Signed)
Message copied by Gala RomneyHORTON, Shena Vinluan P on Thu Jun 17, 2013  9:06 AM ------      Message from: Levert FeinsteinGRANFORTUNA, JAMES M      Created: Thu Jun 10, 2013  8:39 AM       Call pt:  We have confirmed she is a heterozygote for the factor V Leiden gene mutation; negative for the prothrombin gene mutation.  Please mail her a copy of the reports for her file. ------

## 2013-08-02 ENCOUNTER — Encounter: Payer: Self-pay | Admitting: Oncology

## 2014-03-18 ENCOUNTER — Other Ambulatory Visit (HOSPITAL_COMMUNITY): Payer: Self-pay | Admitting: Internal Medicine

## 2014-03-18 DIAGNOSIS — M7989 Other specified soft tissue disorders: Secondary | ICD-10-CM

## 2014-03-18 DIAGNOSIS — M79605 Pain in left leg: Secondary | ICD-10-CM

## 2014-03-18 DIAGNOSIS — M79662 Pain in left lower leg: Secondary | ICD-10-CM

## 2014-03-19 ENCOUNTER — Ambulatory Visit (HOSPITAL_COMMUNITY)
Admission: RE | Admit: 2014-03-19 | Discharge: 2014-03-19 | Disposition: A | Discharge status: Home / Self Care | Payer: BC Managed Care – PPO | Source: Ambulatory Visit | Attending: Internal Medicine | Admitting: Internal Medicine

## 2014-03-19 DIAGNOSIS — M7989 Other specified soft tissue disorders: Secondary | ICD-10-CM | POA: Diagnosis present

## 2014-03-19 DIAGNOSIS — M79662 Pain in left lower leg: Secondary | ICD-10-CM | POA: Diagnosis not present

## 2014-03-19 DIAGNOSIS — M79605 Pain in left leg: Secondary | ICD-10-CM

## 2016-02-12 DIAGNOSIS — H53001 Unspecified amblyopia, right eye: Secondary | ICD-10-CM | POA: Diagnosis not present

## 2016-02-12 DIAGNOSIS — H5000 Unspecified esotropia: Secondary | ICD-10-CM | POA: Diagnosis not present

## 2016-02-12 DIAGNOSIS — H5213 Myopia, bilateral: Secondary | ICD-10-CM | POA: Diagnosis not present

## 2016-07-22 DIAGNOSIS — Z01419 Encounter for gynecological examination (general) (routine) without abnormal findings: Secondary | ICD-10-CM | POA: Diagnosis not present

## 2016-07-22 DIAGNOSIS — N920 Excessive and frequent menstruation with regular cycle: Secondary | ICD-10-CM | POA: Diagnosis not present

## 2016-07-22 DIAGNOSIS — E669 Obesity, unspecified: Secondary | ICD-10-CM | POA: Diagnosis not present

## 2016-07-22 DIAGNOSIS — Z6835 Body mass index (BMI) 35.0-35.9, adult: Secondary | ICD-10-CM | POA: Diagnosis not present

## 2016-07-22 DIAGNOSIS — Z1231 Encounter for screening mammogram for malignant neoplasm of breast: Secondary | ICD-10-CM | POA: Diagnosis not present

## 2016-12-05 ENCOUNTER — Ambulatory Visit (HOSPITAL_COMMUNITY)
Admission: EM | Admit: 2016-12-05 | Discharge: 2016-12-05 | Disposition: A | Discharge status: Home / Self Care | Payer: BLUE CROSS/BLUE SHIELD | Attending: Family Medicine | Admitting: Family Medicine

## 2016-12-05 ENCOUNTER — Encounter (HOSPITAL_COMMUNITY): Payer: Self-pay | Admitting: Emergency Medicine

## 2016-12-05 DIAGNOSIS — L0231 Cutaneous abscess of buttock: Secondary | ICD-10-CM

## 2016-12-05 MED ORDER — CLINDAMYCIN HCL 300 MG PO CAPS: 300.0000 mg | ORAL_CAPSULE | Freq: Three times a day (TID) | ORAL | 0 refills | Status: AC

## 2016-12-05 NOTE — ED Provider Notes (Signed)
CSN: [id number]     Arrival date & time 12/05/16  [id number] History   First MD Initiated Contact with Patient 12/05/16 1109     Chief Complaint  Patient presents with  . Abscess   (Consider location/radiation/quality/duration/timing/severity/associated sxs/prior Treatment) 46 year old female stating at about 4 days ago she felt a small tender lump to the right buttock near the gluteal cleft. Denies any known injury. There was no itching or stinging. It was more tenderness and pain. It quickly became larger and more painful 3 days ago. She started to apply heat and over the past 3 days has gotten much smaller. Denies systemic symptoms. She has a history of Crohn's disease      Past Medical History:  Diagnosis Date  . Antiphospholipid antibody with hypercoagulable state (HCC) 2000   Factor V Leiden  -  [facility]  . Pulmonary embolism (HCC) 11/11/2010   Past Surgical History:  Procedure Laterality Date  . CESAREAN SECTION    . SMALL INTESTINE SURGERY     For an enterovesical fistula  . STRABISMUS SURGERY     S/P correction at age 89 and 87   Family History  Problem Relation Age of Onset  . Breast cancer Mother   . Factor V Leiden deficiency Sister    Social History  Substance Use Topics  . Smoking status: Never Smoker  . Smokeless tobacco: Not on file  . Alcohol use No   OB History    No data available     Review of Systems  Constitutional: Negative.   Gastrointestinal: Negative.   Skin:       As per history of present illness  Neurological: Negative.   All other systems reviewed and are negative.   Allergies  Codeine; Erythromycin; Penicillins; Pentazocine lactate; Sulfa antibiotics; Vicodin [hydrocodone-acetaminophen]; and Shellfish allergy  Home Medications   Prior to Admission medications   Medication Sig Start Date End Date Taking? Authorizing Provider  Aspirin-Acetaminophen-Caffeine (EXCEDRIN MIGRAINE PO) Take by mouth.   Yes [provider]  clindamycin (CLEOCIN) 300 MG capsule Take 1 capsule (300 mg total) by mouth 3 (three) times daily. 12/05/16   Hayden RasmussenMabe, Kalonji Zurawski, NP  diphenhydrAMINE (SOMINEX) 25 MG tablet Take 25 mg by mouth at bedtime as needed for allergies or sleep.    [provider]   Meds Ordered and Administered this Visit  Medications - No data to display  BP 128/88 (BP Location: Left Arm)   Pulse (!) 101 Comment: reported elevated HR to nurse Kim Lapan-Hutchens  Temp 98.9 F (37.2 C) (Oral)   Resp 18   SpO2 98%  No data found.   Physical Exam  Constitutional: She is oriented to person, place, and time. She appears well-developed and well-nourished.  Neck: Neck supple.  Cardiovascular: Normal rate.   Pulmonary/Chest: Effort normal.  Musculoskeletal: Normal range of motion. She exhibits no edema.  Neurological: She is alert and oriented to person, place, and time.  Skin: Skin is warm and dry.  Reason approximately 2 and half centimeter diameter area of induration with erythema extending approximately 1/2 cm in diameter beyond this area. It is tender. It does not extend into the gluteal cleft with the anus. Palpates as relatively shallow. The skin is completely intact. No fistulas, drainage or bleeding or evidence infiltration to the dermis with the exception of overlying cutaneous erythema. Casimiro Needleracy Bast, RN present during exam  Psychiatric: She has a normal mood and affect. Her behavior is normal. Thought content normal.  Nursing note and vitals reviewed.   Urgent Care Course     Procedures (including critical care time)  Labs Review Labs Reviewed - No data to display  Imaging Review No results found.   Visual Acuity Review  Right Eye Distance:   Left Eye Distance:   Bilateral Distance:    Right Eye Near:   Left Eye Near:    Bilateral Near:         MDM   1. Abscess of buttock, right    At this time the lesion appears to be slowly resolving. It is not ready for incision and  drainage. Continue to apply warm compresses 2-3 times a day and take the clindamycin. If you have stomach problems, rash or other signs of allergies to the clindamycin stopped taking it. If this lesion started to become larger, more painful increased redness or drainage will need to follow-up. Meds ordered this encounter  Medications  . Aspirin-Acetaminophen-Caffeine (EXCEDRIN MIGRAINE PO)    Sig: Take by mouth.  . clindamycin (CLEOCIN) 300 MG capsule    Sig: Take 1 capsule (300 mg total) by mouth 3 (three) times daily.    Dispense:  15 capsule    Refill:  0    Order Specific Question:   Supervising Provider    Answer:   Mardella Layman [[id number]]       Hayden Rasmussen, NP 12/05/16 1130

## 2016-12-05 NOTE — Discharge Instructions (Signed)
At this time the lesion appears to be slowly resolving. It is not ready for incision and drainage. Continue to apply warm compresses 2-3 times a day and take the clindamycin. If you have stomach problems, rash or other signs of allergies to the clindamycin stopped taking it. If this lesion started to become larger, more painful increased redness or drainage will need to follow-up.

## 2016-12-05 NOTE — ED Triage Notes (Signed)
Hard mass under right buttocks.  Area has decreased in size some, but hurts badly.  This area was first noticed on Sunday.

## 2016-12-30 DIAGNOSIS — K5090 Crohn's disease, unspecified, without complications: Secondary | ICD-10-CM | POA: Diagnosis not present

## 2016-12-30 DIAGNOSIS — Z0000 Encounter for general adult medical examination without abnormal findings: Secondary | ICD-10-CM | POA: Diagnosis not present

## 2016-12-30 DIAGNOSIS — Z23 Encounter for immunization: Secondary | ICD-10-CM | POA: Diagnosis not present

## 2016-12-30 DIAGNOSIS — D6861 Antiphospholipid syndrome: Secondary | ICD-10-CM | POA: Diagnosis not present

## 2017-01-29 DIAGNOSIS — D72829 Elevated white blood cell count, unspecified: Secondary | ICD-10-CM | POA: Diagnosis not present

## 2017-02-27 DIAGNOSIS — H1032 Unspecified acute conjunctivitis, left eye: Secondary | ICD-10-CM | POA: Diagnosis not present

## 2017-04-09 DIAGNOSIS — H5213 Myopia, bilateral: Secondary | ICD-10-CM | POA: Diagnosis not present

## 2017-04-09 DIAGNOSIS — H524 Presbyopia: Secondary | ICD-10-CM | POA: Diagnosis not present

## 2017-04-09 DIAGNOSIS — H53001 Unspecified amblyopia, right eye: Secondary | ICD-10-CM | POA: Diagnosis not present

## 2017-04-09 DIAGNOSIS — H5000 Unspecified esotropia: Secondary | ICD-10-CM | POA: Diagnosis not present

## 2017-07-07 DIAGNOSIS — L739 Follicular disorder, unspecified: Secondary | ICD-10-CM | POA: Diagnosis not present

## 2017-07-07 DIAGNOSIS — G43909 Migraine, unspecified, not intractable, without status migrainosus: Secondary | ICD-10-CM | POA: Diagnosis not present

## 2018-01-02 ENCOUNTER — Other Ambulatory Visit: Payer: Self-pay | Admitting: Family Medicine

## 2018-01-02 DIAGNOSIS — Z136 Encounter for screening for cardiovascular disorders: Secondary | ICD-10-CM | POA: Diagnosis not present

## 2018-01-02 DIAGNOSIS — K5090 Crohn's disease, unspecified, without complications: Secondary | ICD-10-CM | POA: Diagnosis not present

## 2018-01-02 DIAGNOSIS — R221 Localized swelling, mass and lump, neck: Secondary | ICD-10-CM

## 2018-01-02 DIAGNOSIS — Z0000 Encounter for general adult medical examination without abnormal findings: Secondary | ICD-10-CM | POA: Diagnosis not present

## 2018-01-19 ENCOUNTER — Ambulatory Visit
Admission: RE | Admit: 2018-01-19 | Discharge: 2018-01-19 | Disposition: A | Discharge status: Home / Self Care | Payer: BLUE CROSS/BLUE SHIELD | Source: Ambulatory Visit | Attending: Family Medicine | Admitting: Family Medicine

## 2018-01-19 DIAGNOSIS — R221 Localized swelling, mass and lump, neck: Secondary | ICD-10-CM

## 2018-01-19 DIAGNOSIS — D110 Benign neoplasm of parotid gland: Secondary | ICD-10-CM | POA: Diagnosis not present

## 2018-01-29 DIAGNOSIS — K5080 Crohn's disease of both small and large intestine without complications: Secondary | ICD-10-CM | POA: Diagnosis not present

## 2018-02-03 DIAGNOSIS — K5090 Crohn's disease, unspecified, without complications: Secondary | ICD-10-CM | POA: Diagnosis not present

## 2018-03-06 DIAGNOSIS — Z980 Intestinal bypass and anastomosis status: Secondary | ICD-10-CM | POA: Diagnosis not present

## 2018-03-06 DIAGNOSIS — K5010 Crohn's disease of large intestine without complications: Secondary | ICD-10-CM | POA: Diagnosis not present

## 2018-03-06 DIAGNOSIS — K5289 Other specified noninfective gastroenteritis and colitis: Secondary | ICD-10-CM | POA: Diagnosis not present

## 2018-03-10 DIAGNOSIS — K5010 Crohn's disease of large intestine without complications: Secondary | ICD-10-CM | POA: Diagnosis not present

## 2018-03-10 DIAGNOSIS — K5289 Other specified noninfective gastroenteritis and colitis: Secondary | ICD-10-CM | POA: Diagnosis not present

## 2018-03-23 DIAGNOSIS — K5000 Crohn's disease of small intestine without complications: Secondary | ICD-10-CM | POA: Diagnosis not present

## 2018-04-15 DIAGNOSIS — H524 Presbyopia: Secondary | ICD-10-CM | POA: Diagnosis not present

## 2018-04-15 DIAGNOSIS — H5213 Myopia, bilateral: Secondary | ICD-10-CM | POA: Diagnosis not present

## 2018-04-15 DIAGNOSIS — H52203 Unspecified astigmatism, bilateral: Secondary | ICD-10-CM | POA: Diagnosis not present

## 2018-04-15 DIAGNOSIS — H53001 Unspecified amblyopia, right eye: Secondary | ICD-10-CM | POA: Diagnosis not present

## 2019-02-12 DIAGNOSIS — Z0000 Encounter for general adult medical examination without abnormal findings: Secondary | ICD-10-CM | POA: Diagnosis not present

## 2019-02-12 DIAGNOSIS — K5090 Crohn's disease, unspecified, without complications: Secondary | ICD-10-CM | POA: Diagnosis not present

## 2019-02-12 DIAGNOSIS — Z13220 Encounter for screening for lipoid disorders: Secondary | ICD-10-CM | POA: Diagnosis not present

## 2019-02-12 DIAGNOSIS — Z23 Encounter for immunization: Secondary | ICD-10-CM | POA: Diagnosis not present

## 2019-02-12 DIAGNOSIS — E669 Obesity, unspecified: Secondary | ICD-10-CM | POA: Diagnosis not present

## 2019-04-16 DIAGNOSIS — H5213 Myopia, bilateral: Secondary | ICD-10-CM | POA: Diagnosis not present

## 2019-04-16 DIAGNOSIS — H25043 Posterior subcapsular polar age-related cataract, bilateral: Secondary | ICD-10-CM | POA: Diagnosis not present

## 2019-04-16 DIAGNOSIS — H52203 Unspecified astigmatism, bilateral: Secondary | ICD-10-CM | POA: Diagnosis not present

## 2019-10-01 IMAGING — US US SOFT TISSUE HEAD/NECK
1 series · 14 of 25 positions shown · non-contrast
Comparison: None.

CLINICAL DATA: 47-year-old female with a perceived mass in the
region of the right parotid gland for the past 4-5 months.

EXAM:
ULTRASOUND OF HEAD/NECK SOFT TISSUES
TECHNIQUE: Ultrasound examination of the head and neck soft tissues was
performed in the area of clinical concern.

[Series 1: us soft tissue head/neck · 0.05mm/px · 14 of 40 slices shown]
[im 1/40]
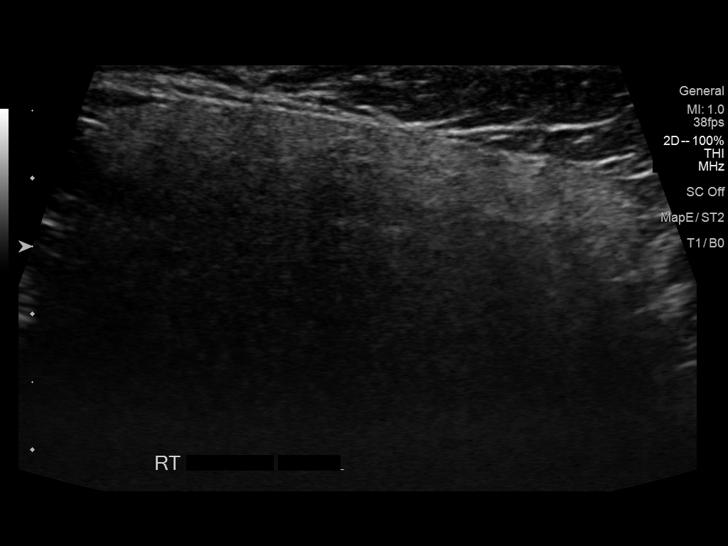
[im 4/40]
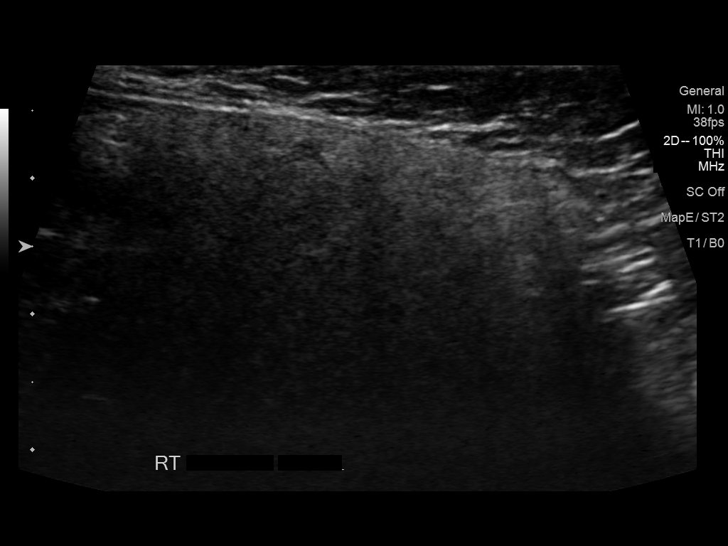
[im 7/40]
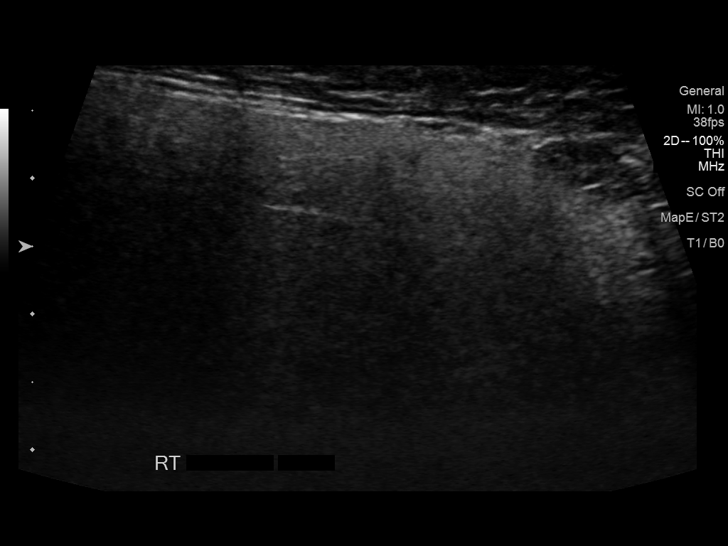
[im 10/40]
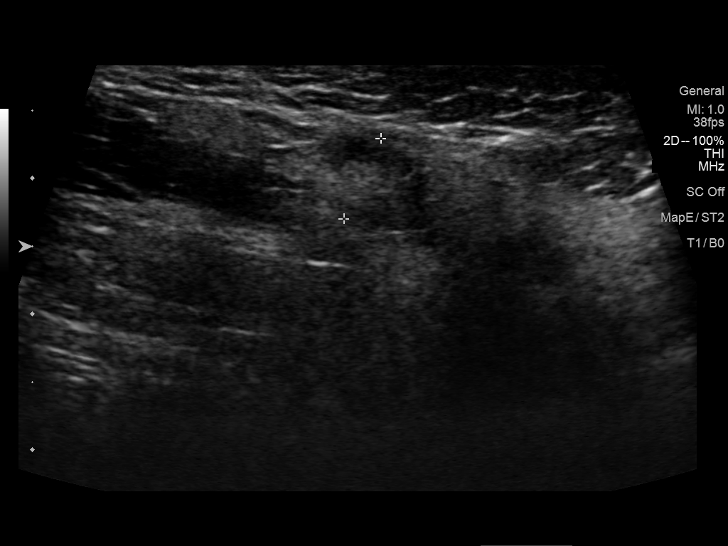
[im 14/40]
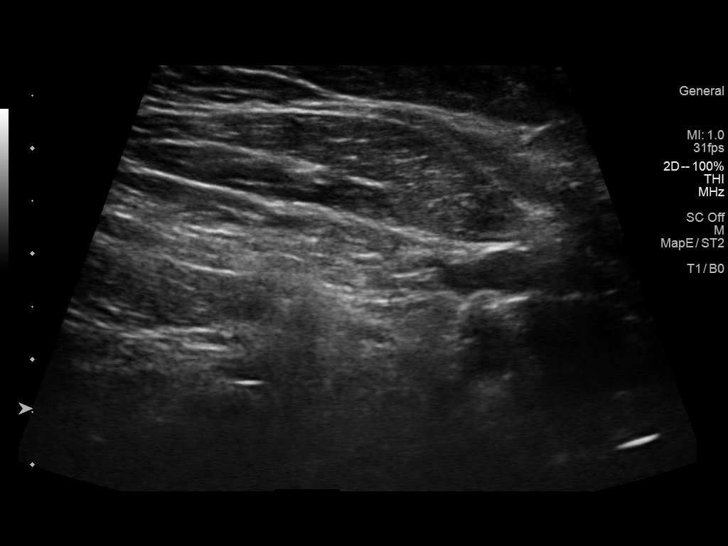
[im 15/40]
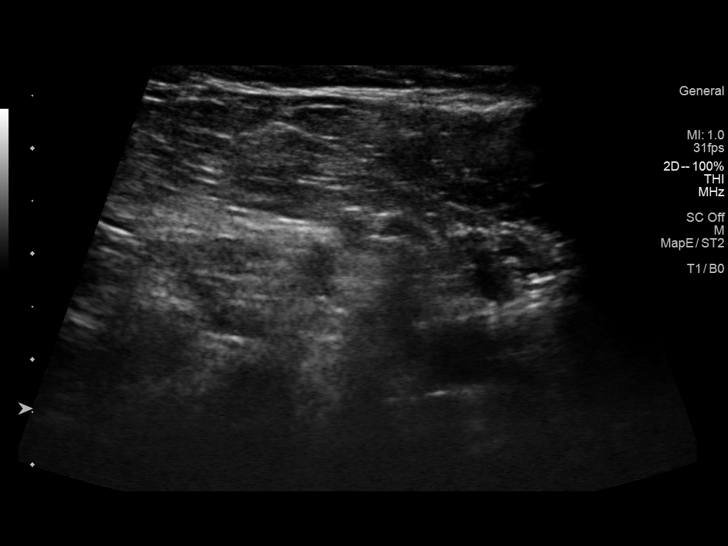
[im 18/40]
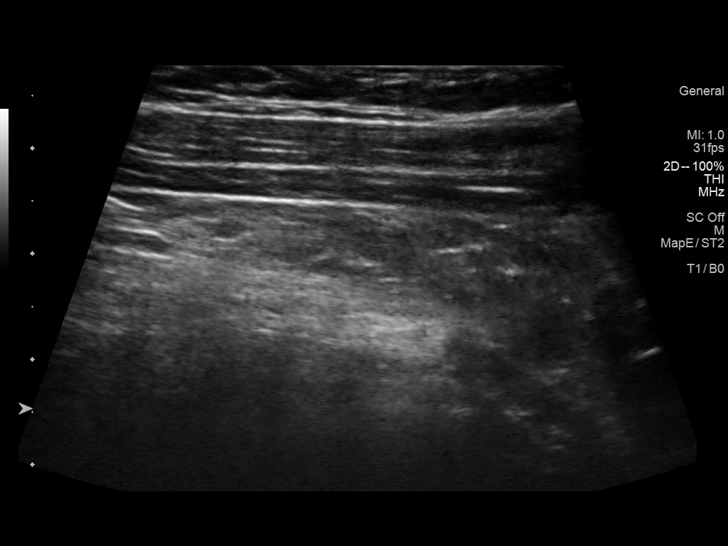
[im 22/40]
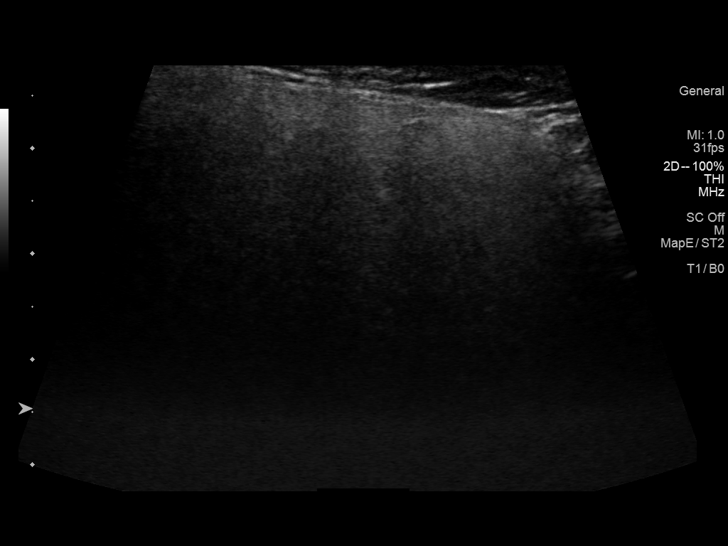
[im 25/40]
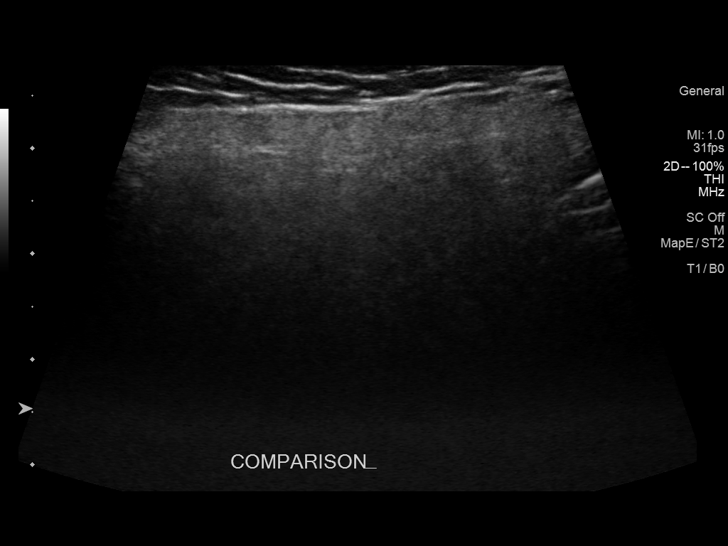
[im 27/40]
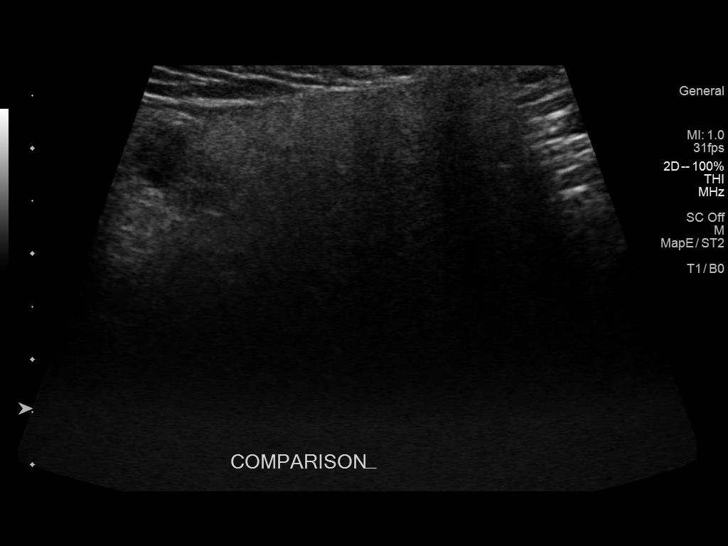
[im 30/40]
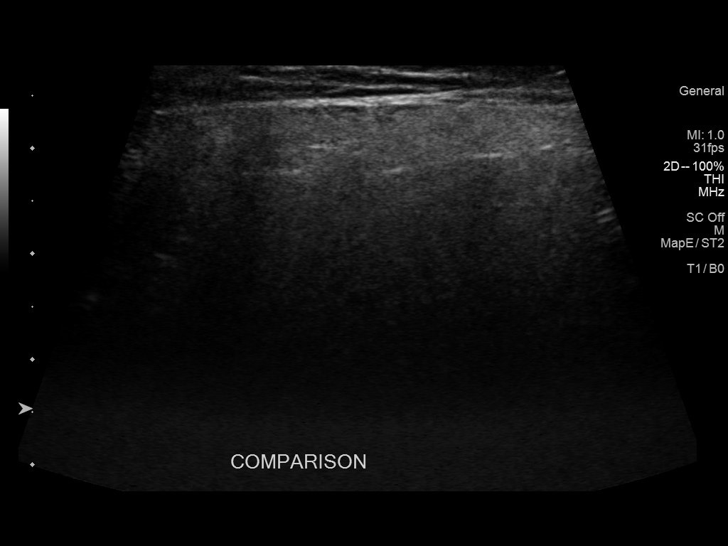
[im 33/40]
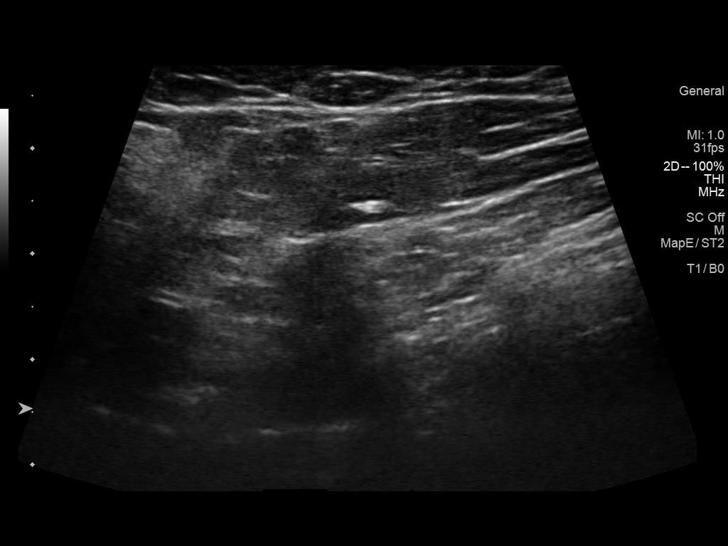
[im 36/40]
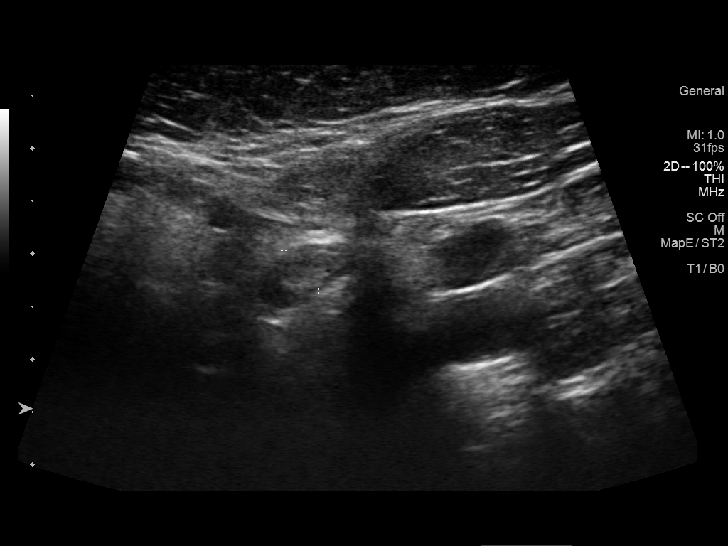
[im 40/40]
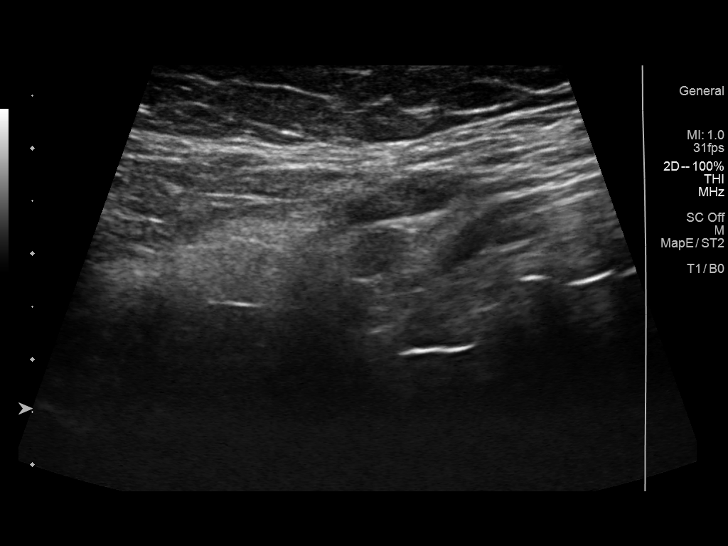

[14 of 25 positions shown; findings below may reference images not displayed]

FINDINGS: Sonographic interrogation of the region of clinical concern
demonstrates normal subcutaneous tissues and an underlying parotid
gland. There is a solitary, morphologically unremarkable lymph node
measuring 0.6 cm in short axis which is not considered enlarged by
imaging criteria. Sonographic evaluation of the contralateral left
neck demonstrates similar appearing and sized lymph nodes. No
evidence of cystic or solid mass within the region of clinical
concern.
IMPRESSION: Negative sonographic evaluation of the region of clinical concern.

## 2020-02-25 ENCOUNTER — Other Ambulatory Visit: Payer: Self-pay | Admitting: Family Medicine

## 2020-02-25 DIAGNOSIS — Z23 Encounter for immunization: Secondary | ICD-10-CM | POA: Diagnosis not present

## 2020-02-25 DIAGNOSIS — Z1231 Encounter for screening mammogram for malignant neoplasm of breast: Secondary | ICD-10-CM

## 2020-02-25 DIAGNOSIS — Z0000 Encounter for general adult medical examination without abnormal findings: Secondary | ICD-10-CM | POA: Diagnosis not present

## 2020-02-25 DIAGNOSIS — Z13220 Encounter for screening for lipoid disorders: Secondary | ICD-10-CM | POA: Diagnosis not present

## 2020-02-25 DIAGNOSIS — K5000 Crohn's disease of small intestine without complications: Secondary | ICD-10-CM | POA: Diagnosis not present

## 2020-03-14 ENCOUNTER — Ambulatory Visit
Admission: RE | Admit: 2020-03-14 | Discharge: 2020-03-14 | Disposition: A | Discharge status: Home / Self Care | Payer: BLUE CROSS/BLUE SHIELD | Source: Ambulatory Visit | Attending: Family Medicine | Admitting: Family Medicine

## 2020-03-14 ENCOUNTER — Other Ambulatory Visit: Payer: Self-pay

## 2020-03-14 DIAGNOSIS — Z1231 Encounter for screening mammogram for malignant neoplasm of breast: Secondary | ICD-10-CM | POA: Diagnosis not present

## 2020-04-17 DIAGNOSIS — H25042 Posterior subcapsular polar age-related cataract, left eye: Secondary | ICD-10-CM | POA: Diagnosis not present

## 2020-04-17 DIAGNOSIS — H5213 Myopia, bilateral: Secondary | ICD-10-CM | POA: Diagnosis not present

## 2020-05-05 DIAGNOSIS — H5000 Unspecified esotropia: Secondary | ICD-10-CM | POA: Diagnosis not present

## 2020-05-05 DIAGNOSIS — H25043 Posterior subcapsular polar age-related cataract, bilateral: Secondary | ICD-10-CM | POA: Diagnosis not present

## 2020-05-05 DIAGNOSIS — H53001 Unspecified amblyopia, right eye: Secondary | ICD-10-CM | POA: Diagnosis not present

## 2020-06-22 DIAGNOSIS — H268 Other specified cataract: Secondary | ICD-10-CM | POA: Diagnosis not present

## 2020-06-22 DIAGNOSIS — H25042 Posterior subcapsular polar age-related cataract, left eye: Secondary | ICD-10-CM | POA: Diagnosis not present

## 2020-06-22 DIAGNOSIS — H25812 Combined forms of age-related cataract, left eye: Secondary | ICD-10-CM | POA: Diagnosis not present

## 2020-12-03 DIAGNOSIS — J989 Respiratory disorder, unspecified: Secondary | ICD-10-CM | POA: Diagnosis not present

## 2020-12-03 DIAGNOSIS — R0602 Shortness of breath: Secondary | ICD-10-CM | POA: Diagnosis not present

## 2020-12-03 DIAGNOSIS — L989 Disorder of the skin and subcutaneous tissue, unspecified: Secondary | ICD-10-CM | POA: Diagnosis not present

## 2020-12-03 DIAGNOSIS — Z86711 Personal history of pulmonary embolism: Secondary | ICD-10-CM | POA: Diagnosis not present

## 2020-12-03 DIAGNOSIS — R7401 Elevation of levels of liver transaminase levels: Secondary | ICD-10-CM | POA: Diagnosis not present

## 2020-12-03 DIAGNOSIS — R509 Fever, unspecified: Secondary | ICD-10-CM | POA: Diagnosis not present

## 2020-12-03 DIAGNOSIS — Z20822 Contact with and (suspected) exposure to covid-19: Secondary | ICD-10-CM | POA: Diagnosis not present

## 2020-12-03 DIAGNOSIS — R110 Nausea: Secondary | ICD-10-CM | POA: Diagnosis not present

## 2020-12-20 DIAGNOSIS — H2000 Unspecified acute and subacute iridocyclitis: Secondary | ICD-10-CM | POA: Diagnosis not present

## 2020-12-26 DIAGNOSIS — H2000 Unspecified acute and subacute iridocyclitis: Secondary | ICD-10-CM | POA: Diagnosis not present

## 2021-03-23 DIAGNOSIS — D6861 Antiphospholipid syndrome: Secondary | ICD-10-CM | POA: Diagnosis not present

## 2021-03-23 DIAGNOSIS — Z23 Encounter for immunization: Secondary | ICD-10-CM | POA: Diagnosis not present

## 2021-03-23 DIAGNOSIS — Z0000 Encounter for general adult medical examination without abnormal findings: Secondary | ICD-10-CM | POA: Diagnosis not present

## 2021-03-23 DIAGNOSIS — E669 Obesity, unspecified: Secondary | ICD-10-CM | POA: Diagnosis not present

## 2021-05-31 DIAGNOSIS — Z23 Encounter for immunization: Secondary | ICD-10-CM | POA: Diagnosis not present

## 2021-05-31 DIAGNOSIS — R748 Abnormal levels of other serum enzymes: Secondary | ICD-10-CM | POA: Diagnosis not present

## 2021-07-24 DIAGNOSIS — H5213 Myopia, bilateral: Secondary | ICD-10-CM | POA: Diagnosis not present

## 2021-07-24 DIAGNOSIS — H2511 Age-related nuclear cataract, right eye: Secondary | ICD-10-CM | POA: Diagnosis not present

## 2021-09-18 DIAGNOSIS — R748 Abnormal levels of other serum enzymes: Secondary | ICD-10-CM | POA: Diagnosis not present

## 2021-11-20 DIAGNOSIS — R748 Abnormal levels of other serum enzymes: Secondary | ICD-10-CM | POA: Diagnosis not present

## 2022-03-25 ENCOUNTER — Other Ambulatory Visit: Payer: Self-pay | Admitting: Family Medicine

## 2022-03-25 DIAGNOSIS — Z23 Encounter for immunization: Secondary | ICD-10-CM | POA: Diagnosis not present

## 2022-03-25 DIAGNOSIS — Z0000 Encounter for general adult medical examination without abnormal findings: Secondary | ICD-10-CM | POA: Diagnosis not present

## 2022-03-25 DIAGNOSIS — Z1231 Encounter for screening mammogram for malignant neoplasm of breast: Secondary | ICD-10-CM

## 2022-03-25 DIAGNOSIS — R748 Abnormal levels of other serum enzymes: Secondary | ICD-10-CM | POA: Diagnosis not present

## 2022-03-25 DIAGNOSIS — L853 Xerosis cutis: Secondary | ICD-10-CM | POA: Diagnosis not present

## 2022-03-25 DIAGNOSIS — E7800 Pure hypercholesterolemia, unspecified: Secondary | ICD-10-CM | POA: Diagnosis not present

## 2022-04-02 DIAGNOSIS — Z01419 Encounter for gynecological examination (general) (routine) without abnormal findings: Secondary | ICD-10-CM | POA: Diagnosis not present

## 2022-04-02 DIAGNOSIS — Z124 Encounter for screening for malignant neoplasm of cervix: Secondary | ICD-10-CM | POA: Diagnosis not present

## 2022-04-02 DIAGNOSIS — Z1151 Encounter for screening for human papillomavirus (HPV): Secondary | ICD-10-CM | POA: Diagnosis not present

## 2022-04-02 DIAGNOSIS — Z6837 Body mass index (BMI) 37.0-37.9, adult: Secondary | ICD-10-CM | POA: Diagnosis not present

## 2022-05-09 DIAGNOSIS — Z713 Dietary counseling and surveillance: Secondary | ICD-10-CM | POA: Diagnosis not present

## 2022-05-09 DIAGNOSIS — Z6837 Body mass index (BMI) 37.0-37.9, adult: Secondary | ICD-10-CM | POA: Diagnosis not present

## 2022-05-15 ENCOUNTER — Ambulatory Visit
Admission: RE | Admit: 2022-05-15 | Discharge: 2022-05-15 | Disposition: A | Discharge status: Home / Self Care | Payer: BC Managed Care – PPO | Source: Ambulatory Visit | Attending: Family Medicine | Admitting: Family Medicine

## 2022-05-15 DIAGNOSIS — Z1231 Encounter for screening mammogram for malignant neoplasm of breast: Secondary | ICD-10-CM

## 2022-11-06 DIAGNOSIS — H53001 Unspecified amblyopia, right eye: Secondary | ICD-10-CM | POA: Diagnosis not present

## 2022-11-06 DIAGNOSIS — H5211 Myopia, right eye: Secondary | ICD-10-CM | POA: Diagnosis not present

## 2023-03-28 DIAGNOSIS — K5000 Crohn's disease of small intestine without complications: Secondary | ICD-10-CM | POA: Diagnosis not present

## 2023-03-28 DIAGNOSIS — Z0000 Encounter for general adult medical examination without abnormal findings: Secondary | ICD-10-CM | POA: Diagnosis not present

## 2023-03-28 DIAGNOSIS — Z23 Encounter for immunization: Secondary | ICD-10-CM | POA: Diagnosis not present

## 2023-03-28 DIAGNOSIS — E7800 Pure hypercholesterolemia, unspecified: Secondary | ICD-10-CM | POA: Diagnosis not present
